# Patient Record
Sex: Female | Born: 1980 | Race: White | Hispanic: No | Marital: Married | Smoking: Never smoker
Health system: Southern US, Community
[De-identification: ages and names within clinical notes are randomized; demographics above are authoritative.]

## PROBLEM LIST (undated history)

## (undated) DIAGNOSIS — Z789 Other specified health status: Secondary | ICD-10-CM

## (undated) DIAGNOSIS — R51 Headache: Secondary | ICD-10-CM

## (undated) DIAGNOSIS — R519 Headache, unspecified: Secondary | ICD-10-CM

## (undated) DIAGNOSIS — E039 Hypothyroidism, unspecified: Secondary | ICD-10-CM

## (undated) HISTORY — PX: COLPOSCOPY: SHX161

## (undated) HISTORY — PX: OTHER SURGICAL HISTORY: SHX169

---
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
## 2005-03-28 ENCOUNTER — Emergency Department (HOSPITAL_COMMUNITY): Admission: EM | Admit: 2005-03-28 | Discharge: 2005-03-29 | Payer: Self-pay | Admitting: Emergency Medicine

## 2005-04-04 ENCOUNTER — Other Ambulatory Visit: Admission: RE | Admit: 2005-04-04 | Discharge: 2005-04-04 | Payer: Self-pay | Admitting: Obstetrics and Gynecology

## 2008-12-10 ENCOUNTER — Inpatient Hospital Stay (HOSPITAL_COMMUNITY): Admission: RE | Admit: 2008-12-10 | Discharge: 2008-12-13 | Payer: Self-pay | Admitting: Obstetrics and Gynecology

## 2010-05-04 LAB — CBC
HCT: 29.1 % — ABNORMAL LOW (ref 36.0–46.0)
HCT: 37.1 % (ref 36.0–46.0)
Hemoglobin: 12.6 g/dL (ref 12.0–15.0)
Hemoglobin: 9.9 g/dL — ABNORMAL LOW (ref 12.0–15.0)
MCHC: 34.1 g/dL (ref 30.0–36.0)
MCHC: 34.2 g/dL (ref 30.0–36.0)
MCV: 85.9 fL (ref 78.0–100.0)
MCV: 86.7 fL (ref 78.0–100.0)
Platelets: 127 10*3/uL — ABNORMAL LOW (ref 150–400)
Platelets: 187 10*3/uL (ref 150–400)
RBC: 3.35 MIL/uL — ABNORMAL LOW (ref 3.87–5.11)
RBC: 4.32 MIL/uL (ref 3.87–5.11)
RDW: 13.7 % (ref 11.5–15.5)
RDW: 13.7 % (ref 11.5–15.5)
WBC: 8.5 10*3/uL (ref 4.0–10.5)
WBC: 9.7 10*3/uL (ref 4.0–10.5)

## 2010-05-04 LAB — RPR: RPR Ser Ql: NONREACTIVE

## 2010-05-24 LAB — RUBELLA ANTIBODY, IGM: Rubella: IMMUNE

## 2010-05-24 LAB — ABO/RH: RH Type: POSITIVE

## 2010-05-24 LAB — RPR: RPR: NONREACTIVE

## 2010-05-24 LAB — ANTIBODY SCREEN: Antibody Screen: NEGATIVE

## 2010-07-15 ENCOUNTER — Inpatient Hospital Stay (HOSPITAL_COMMUNITY)
Admission: AD | Admit: 2010-07-15 | Discharge: 2010-07-16 | Disposition: A | Discharge status: Home / Self Care | Payer: BC Managed Care – PPO | Source: Ambulatory Visit | Attending: Obstetrics and Gynecology | Admitting: Obstetrics and Gynecology

## 2010-07-15 DIAGNOSIS — R109 Unspecified abdominal pain: Secondary | ICD-10-CM | POA: Insufficient documentation

## 2010-07-15 DIAGNOSIS — O99891 Other specified diseases and conditions complicating pregnancy: Secondary | ICD-10-CM | POA: Insufficient documentation

## 2010-07-15 DIAGNOSIS — O9989 Other specified diseases and conditions complicating pregnancy, childbirth and the puerperium: Secondary | ICD-10-CM

## 2010-07-16 LAB — CBC
HCT: 35.8 % — ABNORMAL LOW (ref 36.0–46.0)
Hemoglobin: 12.2 g/dL (ref 12.0–15.0)
MCH: 28 pg (ref 26.0–34.0)
MCHC: 34.1 g/dL (ref 30.0–36.0)
MCV: 82.3 fL (ref 78.0–100.0)
RBC: 4.35 MIL/uL (ref 3.87–5.11)

## 2010-07-16 LAB — URINALYSIS, ROUTINE W REFLEX MICROSCOPIC
Bilirubin Urine: NEGATIVE
Glucose, UA: NEGATIVE mg/dL
Hgb urine dipstick: NEGATIVE
Ketones, ur: NEGATIVE mg/dL
Nitrite: NEGATIVE
Protein, ur: NEGATIVE mg/dL
Specific Gravity, Urine: <1.005 — ABNORMAL LOW (ref 1.005–1.030)
Urobilinogen, UA: 0.2 mg/dL (ref 0.0–1.0)
pH: 6 (ref 5.0–8.0)

## 2010-07-16 LAB — URINE MICROSCOPIC-ADD ON

## 2010-07-16 LAB — WET PREP, GENITAL: Yeast Wet Prep HPF POC: NONE SEEN

## 2010-07-17 LAB — URINE CULTURE
Colony Count: NO GROWTH
Culture: NO GROWTH

## 2010-07-18 LAB — GC/CHLAMYDIA PROBE AMP, GENITAL: Chlamydia, DNA Probe: NEGATIVE

## 2010-12-19 ENCOUNTER — Encounter (HOSPITAL_COMMUNITY): Payer: Self-pay

## 2010-12-20 ENCOUNTER — Encounter (HOSPITAL_COMMUNITY): Payer: Self-pay

## 2010-12-21 ENCOUNTER — Encounter (HOSPITAL_COMMUNITY): Payer: Self-pay

## 2010-12-21 ENCOUNTER — Encounter (HOSPITAL_COMMUNITY)
Admission: RE | Admit: 2010-12-21 | Discharge: 2010-12-21 | Disposition: A | Discharge status: Home / Self Care | Payer: BC Managed Care – PPO | Source: Ambulatory Visit | Attending: Obstetrics and Gynecology | Admitting: Obstetrics and Gynecology

## 2010-12-21 HISTORY — DX: Other specified health status: Z78.9

## 2010-12-21 LAB — SURGICAL PCR SCREEN: MRSA, PCR: NEGATIVE

## 2010-12-21 NOTE — Patient Instructions (Addendum)
YOUR PROCEDURE IS SCHEDULED ON:12/29/10  ENTER THROUGH THE MAIN ENTRANCE OF [facility] AT:6am   USE DESK PHONE AND DIAL [phone] TO INFORM us OF YOUR ARRIVAL  CALL [phone] IF YOU HAVE ANY QUESTIONS OR PROBLEMS PRIOR TO YOUR ARRIVAL.  REMEMBER: DO NOT EAT OR DRINK AFTER MIDNIGHT : Wed  SPECIAL INSTRUCTIONS:   YOU MAY BRUSH YOUR TEETH THE MORNING OF SURGERY   TAKE THESE MEDICINES THE DAY OF SURGERY WITH SIP OF WATER:none   DO NOT WEAR JEWELRY, EYE MAKEUP, LIPSTICK OR DARK FINGERNAIL POLISH DO NOT WEAR LOTIONS OR DEODORANT DO NOT SHAVE FOR 48 HOURS PRIOR TO SURGERY  YOU WILL NOT BE ALLOWED TO DRIVE YOURSELF HOME.  NAME OF DRIVER:Charlie- [phone]

## 2010-12-26 NOTE — H&P (Addendum)
  H&P Dictated # [id number] 12/26/10 Pt seen and examined H&P is current 12/28/713 DL

## 2010-12-26 NOTE — H&P (Signed)
NAME:  Brancato, Valarie              ACCOUNT NO.:  [id number]  MEDICAL RECORD NO.:  [id number]  LOCATION:  SDC                           FACILITY:  [facility]  PHYSICIAN:  Dineen Kid. Rana Snare, M.D.    DATE OF BIRTH:  01/06/1981  DATE OF ADMISSION:  12/21/2010 DATE OF DISCHARGE:  12/21/2010                             HISTORY & PHYSICAL   Ms. Piggott is going to be admitted to [facility] on November 29 for cesarean section.  HISTORY OF PRESENT ILLNESS:  Ms. Orvan Falconer is a 30 year old, G2, P1, presenting at 39-6/7 weeks for repeat cesarean section.  She has a history of previous cesarean section and desires repeat.  Estimated date of confinement is December 30, 2010.  Group B strep is negative. Pregnancy has been uncomplicated with a normal 1st trimester screen, AFP, and normal level 2 ultrasound.  PAST MEDICAL HISTORY:  Negative.  PAST SURGICAL HISTORY:  Cesarean section for borderline pelvis.  MEDICATIONS:  Prenatal vitamins.  ALLERGIES:  She has no known drug allergies.  PHYSICAL EXAM:  VITAL SIGNS:  Her blood pressure is 120/80.  HEART: Regular rate and rhythm. LUNGS:  Clear to auscultation bilaterally. ABDOMEN:  Gravid, nontender. PELVIC:  Cervix is closed, thick, and high.  IMPRESSION AND PLAN:  Intrauterine pregnancy at 39+ weeks gestational age, previous cesarean section, desiring repeat.  PLAN:  Repeat low segment transverse cesarean section.  Risks and benefits were discussed at length.  Informed consent was obtained.     Dineen Kid Rana Snare, M.D.     DCL/MEDQ  D:  12/26/2010  T:  12/26/2010  Job:  [id number]

## 2010-12-28 MED ORDER — DEXTROSE 5 % IV SOLN
1.0000 g | INTRAVENOUS | Status: AC
  Administered 2010-12-29: 1 g via INTRAVENOUS
  Filled 2010-12-28: qty 1

## 2010-12-29 ENCOUNTER — Inpatient Hospital Stay (HOSPITAL_COMMUNITY): Payer: BC Managed Care – PPO | Admitting: Anesthesiology

## 2010-12-29 ENCOUNTER — Encounter (HOSPITAL_COMMUNITY): Payer: Self-pay | Admitting: Anesthesiology

## 2010-12-29 ENCOUNTER — Encounter (HOSPITAL_COMMUNITY): Payer: Self-pay | Admitting: *Deleted

## 2010-12-29 ENCOUNTER — Inpatient Hospital Stay (HOSPITAL_COMMUNITY)
Admission: RE | Admit: 2010-12-29 | Discharge: 2011-01-01 | DRG: 371 | Disposition: A | Discharge status: Home / Self Care | Payer: BC Managed Care – PPO | Source: Ambulatory Visit | Attending: Obstetrics and Gynecology | Admitting: Obstetrics and Gynecology

## 2010-12-29 ENCOUNTER — Encounter (HOSPITAL_COMMUNITY)
Admission: RE | Disposition: A | Discharge status: Home / Self Care | Payer: Self-pay | Source: Ambulatory Visit | Attending: Obstetrics and Gynecology

## 2010-12-29 DIAGNOSIS — Z01818 Encounter for other preprocedural examination: Secondary | ICD-10-CM

## 2010-12-29 DIAGNOSIS — O34219 Maternal care for unspecified type scar from previous cesarean delivery: Principal | ICD-10-CM | POA: Diagnosis present

## 2010-12-29 DIAGNOSIS — Z01812 Encounter for preprocedural laboratory examination: Secondary | ICD-10-CM

## 2010-12-29 DIAGNOSIS — Z98891 History of uterine scar from previous surgery: Secondary | ICD-10-CM

## 2010-12-29 LAB — CBC
Hemoglobin: 12.6 g/dL (ref 12.0–15.0)
MCH: 28.1 pg (ref 26.0–34.0)
MCHC: 33.2 g/dL (ref 30.0–36.0)
MCV: 84.6 fL (ref 78.0–100.0)
RBC: 4.49 MIL/uL (ref 3.87–5.11)

## 2010-12-29 SURGERY — Surgical Case
Anesthesia: Regional

## 2010-12-29 MED ORDER — LACTATED RINGERS IV SOLN
INTRAVENOUS | Status: DC
  Administered 2010-12-29: 1000 mL via INTRAVENOUS
  Administered 2010-12-29: 09:00:00 via INTRAVENOUS

## 2010-12-29 MED ORDER — ONDANSETRON HCL 4 MG/2ML IJ SOLN: 4.0000 mg | Freq: Three times a day (TID) | INTRAMUSCULAR | Status: DC | PRN

## 2010-12-29 MED ORDER — DIBUCAINE 1 % RE OINT: 1.0000 "application " | TOPICAL_OINTMENT | RECTAL | Status: DC | PRN

## 2010-12-29 MED ORDER — SIMETHICONE 80 MG PO CHEW: 80.0000 mg | CHEWABLE_TABLET | ORAL | Status: DC | PRN

## 2010-12-29 MED ORDER — OXYTOCIN 10 UNIT/ML IJ SOLN
INTRAMUSCULAR | Status: AC
  Filled 2010-12-29: qty 2

## 2010-12-29 MED ORDER — SCOPOLAMINE 1 MG/3DAYS TD PT72
1.0000 | MEDICATED_PATCH | Freq: Once | TRANSDERMAL | Status: DC
  Filled 2010-12-29: qty 1

## 2010-12-29 MED ORDER — SCOPOLAMINE 1 MG/3DAYS TD PT72
1.0000 | MEDICATED_PATCH | Freq: Once | TRANSDERMAL | Status: DC
  Administered 2010-12-29: 1.5 mg via TRANSDERMAL

## 2010-12-29 MED ORDER — KETOROLAC TROMETHAMINE 30 MG/ML IJ SOLN: 30.0000 mg | Freq: Four times a day (QID) | INTRAMUSCULAR | Status: AC | PRN

## 2010-12-29 MED ORDER — DIPHENHYDRAMINE HCL 50 MG/ML IJ SOLN
12.5000 mg | INTRAMUSCULAR | Status: DC | PRN
  Filled 2010-12-29: qty 1

## 2010-12-29 MED ORDER — IBUPROFEN 600 MG PO TABS
600.0000 mg | ORAL_TABLET | Freq: Four times a day (QID) | ORAL | Status: DC | PRN
  Administered 2010-12-31: 600 mg via ORAL
  Filled 2010-12-29 (×12): qty 1

## 2010-12-29 MED ORDER — IBUPROFEN 600 MG PO TABS
600.0000 mg | ORAL_TABLET | Freq: Four times a day (QID) | ORAL | Status: DC
  Administered 2010-12-29 – 2011-01-01 (×10): 600 mg via ORAL

## 2010-12-29 MED ORDER — KETOROLAC TROMETHAMINE 60 MG/2ML IM SOLN
60.0000 mg | Freq: Once | INTRAMUSCULAR | Status: AC | PRN
  Administered 2010-12-29: 60 mg via INTRAMUSCULAR

## 2010-12-29 MED ORDER — ONDANSETRON HCL 4 MG/2ML IJ SOLN
INTRAMUSCULAR | Status: DC | PRN
  Administered 2010-12-29: 4 mg via INTRAVENOUS

## 2010-12-29 MED ORDER — NALBUPHINE SYRINGE 5 MG/0.5 ML
5.0000 mg | INJECTION | INTRAMUSCULAR | Status: DC | PRN
  Filled 2010-12-29: qty 1

## 2010-12-29 MED ORDER — ZOLPIDEM TARTRATE 5 MG PO TABS: 5.0000 mg | ORAL_TABLET | Freq: Every evening | ORAL | Status: DC | PRN

## 2010-12-29 MED ORDER — NALOXONE HCL 0.4 MG/ML IJ SOLN: 0.4000 mg | INTRAMUSCULAR | Status: DC | PRN

## 2010-12-29 MED ORDER — MORPHINE SULFATE (PF) 0.5 MG/ML IJ SOLN
INTRAMUSCULAR | Status: DC | PRN
  Administered 2010-12-29: .1 mg via INTRATHECAL

## 2010-12-29 MED ORDER — LACTATED RINGERS IV SOLN
INTRAVENOUS | Status: DC
  Administered 2010-12-30: via INTRAVENOUS

## 2010-12-29 MED ORDER — OXYTOCIN 20 UNITS IN LACTATED RINGERS INFUSION - SIMPLE: 125.0000 mL/h | INTRAVENOUS | Status: AC

## 2010-12-29 MED ORDER — OXYCODONE-ACETAMINOPHEN 5-325 MG PO TABS
1.0000 | ORAL_TABLET | ORAL | Status: DC | PRN
  Administered 2010-12-30 – 2010-12-31 (×5): 1 via ORAL
  Filled 2010-12-29 (×6): qty 1

## 2010-12-29 MED ORDER — ONDANSETRON HCL 4 MG PO TABS: 4.0000 mg | ORAL_TABLET | ORAL | Status: DC | PRN

## 2010-12-29 MED ORDER — SODIUM CHLORIDE 0.9 % IJ SOLN: 3.0000 mL | INTRAMUSCULAR | Status: DC | PRN

## 2010-12-29 MED ORDER — DIPHENHYDRAMINE HCL 25 MG PO CAPS: 25.0000 mg | ORAL_CAPSULE | ORAL | Status: DC | PRN

## 2010-12-29 MED ORDER — ONDANSETRON HCL 4 MG/2ML IJ SOLN
INTRAMUSCULAR | Status: AC
  Filled 2010-12-29: qty 2

## 2010-12-29 MED ORDER — MORPHINE SULFATE 0.5 MG/ML IJ SOLN
INTRAMUSCULAR | Status: AC
  Filled 2010-12-29: qty 10

## 2010-12-29 MED ORDER — HYDROMORPHONE HCL PF 1 MG/ML IJ SOLN: 0.2500 mg | INTRAMUSCULAR | Status: DC | PRN

## 2010-12-29 MED ORDER — FENTANYL CITRATE 0.05 MG/ML IJ SOLN
INTRAMUSCULAR | Status: DC | PRN
  Administered 2010-12-29: 25 ug via INTRATHECAL
  Administered 2010-12-29: 75 ug via INTRAVENOUS

## 2010-12-29 MED ORDER — PRENATAL PLUS 27-1 MG PO TABS
1.0000 | ORAL_TABLET | Freq: Every day | ORAL | Status: DC
  Administered 2010-12-30 – 2011-01-01 (×3): 1 via ORAL
  Filled 2010-12-29 (×3): qty 1

## 2010-12-29 MED ORDER — PHENYLEPHRINE HCL 10 MG/ML IJ SOLN
INTRAMUSCULAR | Status: DC | PRN
  Administered 2010-12-29: 40 ug via INTRAVENOUS

## 2010-12-29 MED ORDER — LANOLIN HYDROUS EX OINT: 1.0000 "application " | TOPICAL_OINTMENT | CUTANEOUS | Status: DC | PRN

## 2010-12-29 MED ORDER — SCOPOLAMINE 1 MG/3DAYS TD PT72
MEDICATED_PATCH | TRANSDERMAL | Status: AC
  Administered 2010-12-29: 1.5 mg via TRANSDERMAL
  Filled 2010-12-29: qty 1

## 2010-12-29 MED ORDER — PRENATAL PLUS 27-1 MG PO TABS: 1.0000 | ORAL_TABLET | Freq: Every day | ORAL | Status: DC

## 2010-12-29 MED ORDER — WITCH HAZEL-GLYCERIN EX PADS: 1.0000 "application " | MEDICATED_PAD | CUTANEOUS | Status: DC | PRN

## 2010-12-29 MED ORDER — METOCLOPRAMIDE HCL 5 MG/ML IJ SOLN: 10.0000 mg | Freq: Three times a day (TID) | INTRAMUSCULAR | Status: DC | PRN

## 2010-12-29 MED ORDER — LACTATED RINGERS IV SOLN
INTRAVENOUS | Status: DC | PRN
  Administered 2010-12-29 (×2): via INTRAVENOUS

## 2010-12-29 MED ORDER — KETOROLAC TROMETHAMINE 60 MG/2ML IM SOLN
INTRAMUSCULAR | Status: AC
  Filled 2010-12-29: qty 2

## 2010-12-29 MED ORDER — ONDANSETRON HCL 4 MG/2ML IJ SOLN: 4.0000 mg | INTRAMUSCULAR | Status: DC | PRN

## 2010-12-29 MED ORDER — OXYTOCIN 20 UNITS IN LACTATED RINGERS INFUSION - SIMPLE
INTRAVENOUS | Status: DC | PRN
  Administered 2010-12-29: 20 [IU] via INTRAVENOUS

## 2010-12-29 MED ORDER — SENNOSIDES-DOCUSATE SODIUM 8.6-50 MG PO TABS
2.0000 | ORAL_TABLET | Freq: Every day | ORAL | Status: DC
  Administered 2010-12-29 – 2010-12-31 (×3): 2 via ORAL

## 2010-12-29 MED ORDER — DIPHENHYDRAMINE HCL 50 MG/ML IJ SOLN: 25.0000 mg | INTRAMUSCULAR | Status: DC | PRN

## 2010-12-29 MED ORDER — PHENYLEPHRINE 40 MCG/ML (10ML) SYRINGE FOR IV PUSH (FOR BLOOD PRESSURE SUPPORT)
PREFILLED_SYRINGE | INTRAVENOUS | Status: AC
  Filled 2010-12-29: qty 5

## 2010-12-29 MED ORDER — SODIUM CHLORIDE 0.9 % IV SOLN
1.0000 ug/kg/h | INTRAVENOUS | Status: DC | PRN
  Filled 2010-12-29: qty 2.5

## 2010-12-29 MED ORDER — SIMETHICONE 80 MG PO CHEW
80.0000 mg | CHEWABLE_TABLET | Freq: Three times a day (TID) | ORAL | Status: DC
  Administered 2010-12-29 – 2011-01-01 (×11): 80 mg via ORAL

## 2010-12-29 MED ORDER — MEPERIDINE HCL 25 MG/ML IJ SOLN: 6.2500 mg | INTRAMUSCULAR | Status: DC | PRN

## 2010-12-29 MED ORDER — MENTHOL 3 MG MT LOZG: 1.0000 | LOZENGE | OROMUCOSAL | Status: DC | PRN

## 2010-12-29 MED ORDER — DIPHENHYDRAMINE HCL 25 MG PO CAPS: 25.0000 mg | ORAL_CAPSULE | Freq: Four times a day (QID) | ORAL | Status: DC | PRN

## 2010-12-29 MED ORDER — BUPIVACAINE IN DEXTROSE 0.75-8.25 % IT SOLN
INTRATHECAL | Status: DC | PRN
  Administered 2010-12-29: 12 mg via INTRATHECAL

## 2010-12-29 MED ORDER — FENTANYL CITRATE 0.05 MG/ML IJ SOLN
INTRAMUSCULAR | Status: AC
  Filled 2010-12-29: qty 2

## 2010-12-29 MED ORDER — TETANUS-DIPHTH-ACELL PERTUSSIS 5-2.5-18.5 LF-MCG/0.5 IM SUSP: 0.5000 mL | Freq: Once | INTRAMUSCULAR | Status: DC

## 2010-12-29 SURGICAL SUPPLY — 27 items
CLOTH BEACON ORANGE TIMEOUT ST (SAFETY) ×2 IMPLANT
DRESSING TELFA 8X3 (GAUZE/BANDAGES/DRESSINGS) ×2 IMPLANT
DRSG PAD ABDOMINAL 8X10 ST (GAUZE/BANDAGES/DRESSINGS) ×1 IMPLANT
ELECT REM PT RETURN 9FT ADLT (ELECTROSURGICAL) ×2
ELECTRODE REM PT RTRN 9FT ADLT (ELECTROSURGICAL) ×1 IMPLANT
EXTRACTOR VACUUM M CUP 4 TUBE (SUCTIONS) ×1 IMPLANT
GAUZE SPONGE 4X4 12PLY STRL LF (GAUZE/BANDAGES/DRESSINGS) ×1 IMPLANT
GLOVE BIO SURGEON STRL SZ8 (GLOVE) ×2 IMPLANT
GLOVE SURG ORTHO 8.0 STRL STRW (GLOVE) ×2 IMPLANT
GOWN PREVENTION PLUS LG XLONG (DISPOSABLE) ×4 IMPLANT
KIT ABG SYR 3ML LUER SLIP (SYRINGE) ×2 IMPLANT
NDL HYPO 25X5/8 SAFETYGLIDE (NEEDLE) ×1 IMPLANT
NEEDLE HYPO 25X5/8 SAFETYGLIDE (NEEDLE) ×2 IMPLANT
NS IRRIG 1000ML POUR BTL (IV SOLUTION) ×2 IMPLANT
PACK C SECTION WH (CUSTOM PROCEDURE TRAY) ×2 IMPLANT
PAD ABD 7.5X8 STRL (GAUZE/BANDAGES/DRESSINGS) ×1 IMPLANT
SLEEVE SCD COMPRESS KNEE MED (MISCELLANEOUS) IMPLANT
STAPLER VISISTAT 35W (STAPLE) IMPLANT
SUT MNCRL 0 VIOLET CTX 36 (SUTURE) ×3 IMPLANT
SUT MONOCRYL 0 CTX 36 (SUTURE) ×3
SUT PDS AB 1 CT  36 (SUTURE)
SUT PDS AB 1 CT 36 (SUTURE) IMPLANT
SUT VIC AB 1 CTX 36 (SUTURE)
SUT VIC AB 1 CTX36XBRD ANBCTRL (SUTURE) IMPLANT
TOWEL OR 17X24 6PK STRL BLUE (TOWEL DISPOSABLE) ×4 IMPLANT
TRAY FOLEY CATH 14FR (SET/KITS/TRAYS/PACK) ×2 IMPLANT
WATER STERILE IRR 1000ML POUR (IV SOLUTION) ×2 IMPLANT

## 2010-12-29 NOTE — Addendum Note (Signed)
Addendum  created 12/29/10 1136 by Marrion Coy   Modules edited:Anesthesia Medication Administration

## 2010-12-29 NOTE — Addendum Note (Signed)
Addendum  created 12/29/10 1338 by Mauricia Area, CRNA   Modules edited:Notes Section

## 2010-12-29 NOTE — Anesthesia Postprocedure Evaluation (Signed)
Anesthesia Post Note  Patient: Michaela Valentine  Procedure(s) Performed:  CESAREAN SECTION - repeat  Anesthesia type: Spinal  Patient location: PACU  Post pain: Pain level controlled  Post assessment: Post-op Vital signs reviewed  Last Vitals:  Filed Vitals:   12/29/10 0845  BP: 111/65  Pulse: 76  Temp:   Resp: 18    Post vital signs: Reviewed  Level of consciousness: awake  Complications: No apparent anesthesia complications

## 2010-12-29 NOTE — Anesthesia Postprocedure Evaluation (Signed)
  Anesthesia Post-op Note  Patient: Michaela Valentine  Procedure(s) Performed:  CESAREAN SECTION - repeat  Patient Location: Mother/Baby  Anesthesia Type: Spinal  Level of Consciousness: awake, alert  and oriented  Airway and Oxygen Therapy: Patient Spontanous Breathing  Post-op Pain: mild  Post-op Assessment: Post-op Vital signs reviewed, Patient's Cardiovascular Status Stable, Respiratory Function Stable, Patent Airway and Pain level controlled  Post-op Vital Signs: Reviewed and stable  Complications: No apparent anesthesia complications

## 2010-12-29 NOTE — Addendum Note (Signed)
Addendum  created 12/29/10 1136 by Jasen Hartstein   Modules edited:Anesthesia Medication Administration    

## 2010-12-29 NOTE — Anesthesia Preprocedure Evaluation (Signed)
Anesthesia Evaluation  Patient identified by MRN, date of birth, ID band Patient awake    Reviewed: Allergy & Precautions, H&P , Patient's Chart, lab work & pertinent test results  Airway Mallampati: II TM Distance: >3 FB Neck ROM: full    Dental No notable dental hx.    Pulmonary  clear to auscultation  Pulmonary exam normal       Cardiovascular Exercise Tolerance: Good regular Normal    Neuro/Psych    GI/Hepatic   Endo/Other  Morbid obesity  Renal/GU      Musculoskeletal   Abdominal   Peds  Hematology   Anesthesia Other Findings   Reproductive/Obstetrics                           Anesthesia Physical Anesthesia Plan  ASA: III  Anesthesia Plan: General   Post-op Pain Management:    Induction: Intravenous  Airway Management Planned: Oral ETT  Additional Equipment:   Intra-op Plan:   Post-operative Plan:   Informed Consent: I have reviewed the patients History and Physical, chart, labs and discussed the procedure including the risks, benefits and alternatives for the proposed anesthesia with the patient or authorized representative who has indicated his/her understanding and acceptance.   Dental Advisory Given and Dental advisory given  Plan Discussed with: CRNA and Surgeon  Anesthesia Plan Comments: (  Discussed  general anesthesia, including possible nausea, instrumentation of airway, sore throat,pulmonary aspiration, etc. I asked if the were any outstanding questions, or  concerns before we proceeded. )        Anesthesia Quick Evaluation

## 2010-12-29 NOTE — Anesthesia Procedure Notes (Addendum)
Spinal  Patient location during procedure: OR Staffing Performed by: anesthesiologist  Preanesthetic Checklist Completed: patient identified, site marked, surgical consent, pre-op evaluation, timeout performed, IV checked, risks and benefits discussed and monitors and equipment checked Spinal Block Patient position: sitting Prep: DuraPrep Patient monitoring: heart rate, cardiac monitor, continuous pulse ox and blood pressure Approach: midline Location: L3-4 Injection technique: single-shot Needle Needle type: Sprotte  Needle gauge: 24 G Needle length: 9 cm Assessment Sensory level: T4 Additional Notes Patient identified.  Risk benefits discussed including failed block, incomplete pain control, headache, nerve damage, paralysis, blood pressure changes, nausea, vomiting, reactions to medication both toxic or allergic, and postpartum back pain.  Patient expressed understanding and wished to proceed.  All questions were answered.  Sterile technique used throughout procedure.  CSF was clear.  No parasthesia or other complications.  Please see nursing notes for vital signs.

## 2010-12-29 NOTE — Op Note (Signed)
Cesarean Section Procedure Note  Pre-operative Diagnosis: IUP at 39 weeks, Prev C/S for repeat  Post-operative Diagnosis: same  Surgeon: Turner Daniels   Assistants: none  Anesthesia:Spinal  Procedure:  Low Segment Transverse cesarean section  Procedure Details  The patient was seen in the Holding Room. The risks, benefits, complications, treatment options, and expected outcomes were discussed with the patient.  The patient concurred with the proposed plan, giving informed consent.  The site of surgery properly noted/marked.. A Time Out was held and the above information confirmed.  After induction of anesthesia, the patient was draped and prepped in the usual sterile manner. A Pfannenstiel incision was made and carried down through the subcutaneous tissue to the fascia. Fascial incision was made and extended transversely. The fascia was separated from the underlying rectus tissue superiorly and inferiorly. The peritoneum was identified and entered. Peritoneal incision was extended longitudinally. The utero-vesical peritoneal reflection was incised transversely and the bladder flap was bluntly freed from the lower uterine segment. A low transverse uterine incision was made. Delivered from Vertex presentation with a Vacuum extractor 2 pulls was a 8+9 pound baby with Apgar scores of 9 at one minute and 9 at five minutes. After the umbilical cord was clamped and cut cord blood was obtained for evaluation. The placenta was removed intact and appeared normal. The uterine outline, tubes and ovaries appeared normal. The uterine incision was closed with running locked sutures of 0 monocryl and imbricated with 0 monocryl. Hemostasis was observed. Lavage was carried out until clear. The peritoneum was then closed with 0 monocryl and rectus muscles plicated in the midline.  After hemostasis was assured, the fascia was then reapproximated with running sutures of 0 PDS. Irrigation was applied and after adequate  hemostasis was assured, the skin was reapproximated with staples.  Instrument, sponge, and needle counts were correct prior the abdominal closure and at the conclusion of the case. The patient received 1 gram cefotetan preoperatively.  Findings: Viable female, ph art 7.30  Estimated Blood Loss:  600         Specimens: Placenta was sent to L&D         Complications:  None

## 2010-12-29 NOTE — Consult Note (Addendum)
Neonatology Note:  Attendance at C-section:  I was asked to attend this repeat C/S at term. The mother is a G2P1 GBS neg with previous c/s. ROM at delivery, fluid clear. Vacuum-assisted delivery. Infant vigorous with good spontaneous cry and tone. Needed only minimal bulb suctioning. Ap 9/9. Lungs clear to ausc in DR. To CN to care of Pediatrician.  Sharonda Llamas, MD  

## 2010-12-29 NOTE — Transfer of Care (Signed)
Immediate Anesthesia Transfer of Care Note  Patient: Michaela Valentine  Procedure(s) Performed:  CESAREAN SECTION - repeat  Patient Location: PACU  Anesthesia Type: Spinal  Level of Consciousness: awake, alert  and oriented  Airway & Oxygen Therapy: Patient Spontanous Breathing  Post-op Assessment: Report given to PACU RN and Post -op Vital signs reviewed and stable  Post vital signs: Reviewed and stable  Complications: No apparent anesthesia complications

## 2010-12-30 LAB — CBC
MCH: 28.5 pg (ref 26.0–34.0)
MCHC: 33.1 g/dL (ref 30.0–36.0)
Platelets: 154 10*3/uL (ref 150–400)
RBC: 3.86 MIL/uL — ABNORMAL LOW (ref 3.87–5.11)
RDW: 14.4 % (ref 11.5–15.5)

## 2010-12-30 NOTE — Progress Notes (Signed)
Subjective: Postpartum Day 1: Cesarean Delivery Patient reports tolerating PO.    Objective: Vital signs in last 24 hours: Temp:  [97.4 F (36.3 C)-98.5 F (36.9 C)] 98.4 F (36.9 C) (11/30 0445) Pulse Rate:  [56-77] 62  (11/30 0445) Resp:  [16-22] 20  (11/30 0445) BP: (96-118)/(56-75) 104/63 mmHg (11/30 0445) SpO2:  [95 %-100 %] 97 % (11/30 0445) Weight:  [97.07 kg (214 lb)] 214 lb (97.07 kg) (11/29 1427)  Physical Exam:  General: alert and cooperative Lochia: appropriate Uterine Fundus: firm abd dressing CDI Voiding s difficulty DVT Evaluation: No evidence of DVT seen on physical exam.   Basename 12/30/10 0525 12/29/10 0618  HGB 11.0* 12.6  HCT 33.2* 38.0    Assessment/Plan: Status post Cesarean section. Doing well postoperatively.  Continue current care.  Donaciano Range G 12/30/2010, 8:36 AM

## 2010-12-31 NOTE — Progress Notes (Signed)
Subjective: Postpartum Day 2 Cesarean Delivery Patient reports incisional pain, tolerating PO and no problems voiding.    Objective: Vital signs in last 24 hours: Temp:  [98.1 F (36.7 C)-98.4 F (36.9 C)] 98.1 F (36.7 C) (11/30 2100) Pulse Rate:  [87-98] 98  (11/30 2100) Resp:  [18-20] 20  (11/30 2100) BP: (122-126)/(83-87) 126/83 mmHg (11/30 2100)  Physical Exam:  General: alert and cooperative Lochia: appropriate Uterine Fundus: firm Incision: healing well, no significant drainage, no dehiscence, no significant erythema DVT Evaluation: No evidence of DVT seen on physical exam.   Basename 12/30/10 0525 12/29/10 0618  HGB 11.0* 12.6  HCT 33.2* 38.0    Assessment/Plan: Status post Cesarean section. Doing well postoperatively.  Continue current care.  Keaisha Sublette L 12/31/2010, 10:01 AM

## 2011-01-01 MED ORDER — OXYCODONE-ACETAMINOPHEN 5-325 MG PO TABS: 1.0000 | ORAL_TABLET | ORAL | Status: AC | PRN

## 2011-01-01 MED ORDER — IBUPROFEN 600 MG PO TABS: 600.0000 mg | ORAL_TABLET | Freq: Four times a day (QID) | ORAL | Status: AC | PRN

## 2011-01-01 NOTE — Progress Notes (Signed)
Subjective: Postpartum Day 3: Cesarean Delivery Patient reports tolerating PO, + flatus and no problems voiding.    Objective: Vital signs in last 24 hours: Temp:  [97.9 F (36.6 C)-98.6 F (37 C)] 98.6 F (37 C) (12/02 0512) Pulse Rate:  [76-85] 76  (12/02 0512) Resp:  [18-20] 18  (12/02 0512) BP: (105-130)/(68-77) 113/76 mmHg (12/02 2956)  Physical Exam:  General: alert and cooperative Lochia: appropriate Uterine Fundus: firm Incision: healing well, no significant drainage, no dehiscence, no significant erythema DVT Evaluation: No evidence of DVT seen on physical exam.   Basename 12/30/10 0525  HGB 11.0*  HCT 33.2*    Assessment/Plan: Status post Cesarean section. Doing well postoperatively.  Discharge home with standard precautions and return to clinic on Wed to remove staples rx ibuprofen and percocet.  Jarrod Bodkins L 01/01/2011, 8:37 AM

## 2011-01-01 NOTE — Plan of Care (Signed)
Problem: Discharge Progression Outcomes Goal: Remove staples per MD order Outcome: Not Applicable Date Met:  01/01/11 Staples to be removed in MD office.

## 2011-01-01 NOTE — Discharge Summary (Signed)
Obstetric Discharge Summary Reason for Admission: cesarean section Prenatal Procedures: none Intrapartum Procedures: cesarean: low cervical, transverse Postpartum Procedures: none Complications-Operative and Postpartum: none Hemoglobin  Date Value Range Status  12/30/2010 11.0* 12.0-15.0 (g/dL) Final     HCT  Date Value Range Status  12/30/2010 33.2* 36.0-46.0 (%) Final    Discharge Diagnoses: Term Pregnancy-delivered  Discharge Information: Date: 01/01/2011 Activity: pelvic rest Diet: routine Medications: Ibuprofen and Percocet Condition: improved Instructions: refer to practice specific booklet Discharge to: home   Newborn Data: Live born female  Birth Weight: 8 lb 9.7 oz (3905 g) APGAR: 9, 9  Home with mother.  Iyauna Sing L 01/01/2011, 8:37 AM

## 2011-01-02 ENCOUNTER — Encounter (HOSPITAL_COMMUNITY): Payer: Self-pay | Admitting: Obstetrics and Gynecology

## 2011-05-02 ENCOUNTER — Other Ambulatory Visit: Payer: Self-pay | Admitting: Neurology

## 2011-05-02 DIAGNOSIS — H02402 Unspecified ptosis of left eyelid: Secondary | ICD-10-CM

## 2011-05-02 DIAGNOSIS — H93A9 Pulsatile tinnitus, unspecified ear: Secondary | ICD-10-CM

## 2011-05-11 ENCOUNTER — Ambulatory Visit
Admission: RE | Admit: 2011-05-11 | Discharge: 2011-05-11 | Disposition: A | Discharge status: Home / Self Care | Payer: BC Managed Care – PPO | Source: Ambulatory Visit | Attending: Neurology | Admitting: Neurology

## 2011-05-11 DIAGNOSIS — H02402 Unspecified ptosis of left eyelid: Secondary | ICD-10-CM

## 2011-05-11 DIAGNOSIS — H93A9 Pulsatile tinnitus, unspecified ear: Secondary | ICD-10-CM

## 2011-06-13 ENCOUNTER — Other Ambulatory Visit: Payer: Self-pay | Admitting: Otolaryngology

## 2011-06-13 DIAGNOSIS — J323 Chronic sphenoidal sinusitis: Secondary | ICD-10-CM

## 2011-06-15 ENCOUNTER — Ambulatory Visit
Admission: RE | Admit: 2011-06-15 | Discharge: 2011-06-15 | Disposition: A | Discharge status: Home / Self Care | Payer: BC Managed Care – PPO | Source: Ambulatory Visit | Attending: Otolaryngology | Admitting: Otolaryngology

## 2011-06-15 DIAGNOSIS — J323 Chronic sphenoidal sinusitis: Secondary | ICD-10-CM

## 2011-06-21 ENCOUNTER — Other Ambulatory Visit: Payer: Self-pay | Admitting: Otolaryngology

## 2013-06-26 LAB — OB RESULTS CONSOLE RPR: RPR: NONREACTIVE

## 2013-06-26 LAB — OB RESULTS CONSOLE HIV ANTIBODY (ROUTINE TESTING): HIV: NONREACTIVE

## 2013-06-26 LAB — OB RESULTS CONSOLE RUBELLA ANTIBODY, IGM: Rubella: IMMUNE

## 2013-06-26 LAB — OB RESULTS CONSOLE HEPATITIS B SURFACE ANTIGEN: HEP B S AG: NEGATIVE

## 2013-07-18 LAB — OB RESULTS CONSOLE GC/CHLAMYDIA
CHLAMYDIA, DNA PROBE: NEGATIVE
Gonorrhea: NEGATIVE

## 2013-10-07 ENCOUNTER — Other Ambulatory Visit (HOSPITAL_COMMUNITY): Payer: Self-pay | Admitting: Obstetrics and Gynecology

## 2013-10-07 ENCOUNTER — Other Ambulatory Visit: Payer: Self-pay

## 2013-10-07 DIAGNOSIS — O358XX1 Maternal care for other (suspected) fetal abnormality and damage, fetus 1: Secondary | ICD-10-CM

## 2013-10-08 ENCOUNTER — Ambulatory Visit (HOSPITAL_COMMUNITY)
Admission: RE | Admit: 2013-10-08 | Discharge: 2013-10-08 | Disposition: A | Discharge status: Home / Self Care | Payer: BC Managed Care – PPO | Source: Ambulatory Visit | Attending: Obstetrics and Gynecology | Admitting: Obstetrics and Gynecology

## 2013-10-08 ENCOUNTER — Encounter (HOSPITAL_COMMUNITY): Payer: Self-pay

## 2013-10-08 VITALS — BP 137/80 | Wt 212.0 lb

## 2013-10-08 DIAGNOSIS — Z3689 Encounter for other specified antenatal screening: Secondary | ICD-10-CM | POA: Diagnosis not present

## 2013-10-08 DIAGNOSIS — O358XX0 Maternal care for other (suspected) fetal abnormality and damage, not applicable or unspecified: Secondary | ICD-10-CM | POA: Insufficient documentation

## 2013-10-08 DIAGNOSIS — O358XX1 Maternal care for other (suspected) fetal abnormality and damage, fetus 1: Secondary | ICD-10-CM

## 2013-11-11 ENCOUNTER — Ambulatory Visit (HOSPITAL_COMMUNITY): Payer: BC Managed Care – PPO

## 2013-11-11 ENCOUNTER — Encounter (HOSPITAL_COMMUNITY): Payer: Self-pay

## 2013-11-11 ENCOUNTER — Ambulatory Visit (HOSPITAL_COMMUNITY)
Admission: RE | Admit: 2013-11-11 | Discharge: 2013-11-11 | Disposition: A | Discharge status: Home / Self Care | Payer: BC Managed Care – PPO | Source: Ambulatory Visit | Attending: Obstetrics and Gynecology | Admitting: Obstetrics and Gynecology

## 2013-11-11 VITALS — BP 123/68 | HR 115 | Wt 212.0 lb

## 2013-11-11 DIAGNOSIS — O358XX0 Maternal care for other (suspected) fetal abnormality and damage, not applicable or unspecified: Secondary | ICD-10-CM | POA: Diagnosis present

## 2013-11-11 DIAGNOSIS — O35AXX0 Maternal care for other (suspected) fetal abnormality and damage, fetal facial anomalies, not applicable or unspecified: Secondary | ICD-10-CM

## 2013-11-11 DIAGNOSIS — O358XX1 Maternal care for other (suspected) fetal abnormality and damage, fetus 1: Secondary | ICD-10-CM

## 2013-11-11 DIAGNOSIS — Z3A28 28 weeks gestation of pregnancy: Secondary | ICD-10-CM | POA: Diagnosis not present

## 2013-11-19 ENCOUNTER — Ambulatory Visit (HOSPITAL_COMMUNITY): Payer: BC Managed Care – PPO

## 2013-11-25 ENCOUNTER — Other Ambulatory Visit (HOSPITAL_COMMUNITY): Payer: Self-pay | Admitting: Maternal and Fetal Medicine

## 2013-11-25 DIAGNOSIS — O99283 Endocrine, nutritional and metabolic diseases complicating pregnancy, third trimester: Principal | ICD-10-CM

## 2013-11-25 DIAGNOSIS — Q369 Cleft lip, unilateral: Secondary | ICD-10-CM

## 2013-11-25 DIAGNOSIS — O34219 Maternal care for unspecified type scar from previous cesarean delivery: Secondary | ICD-10-CM

## 2013-11-25 DIAGNOSIS — E039 Hypothyroidism, unspecified: Secondary | ICD-10-CM

## 2013-12-01 ENCOUNTER — Encounter (HOSPITAL_COMMUNITY): Payer: Self-pay

## 2013-12-11 ENCOUNTER — Other Ambulatory Visit (HOSPITAL_COMMUNITY): Payer: Self-pay | Admitting: Maternal and Fetal Medicine

## 2013-12-11 ENCOUNTER — Ambulatory Visit (HOSPITAL_COMMUNITY)
Admission: RE | Admit: 2013-12-11 | Discharge: 2013-12-11 | Disposition: A | Discharge status: Home / Self Care | Payer: BC Managed Care – PPO | Source: Ambulatory Visit | Attending: Obstetrics and Gynecology | Admitting: Obstetrics and Gynecology

## 2013-12-11 ENCOUNTER — Encounter (HOSPITAL_COMMUNITY): Payer: Self-pay

## 2013-12-11 VITALS — BP 108/67 | HR 95 | Wt 214.0 lb

## 2013-12-11 DIAGNOSIS — Q369 Cleft lip, unilateral: Secondary | ICD-10-CM

## 2013-12-11 DIAGNOSIS — O358XX0 Maternal care for other (suspected) fetal abnormality and damage, not applicable or unspecified: Secondary | ICD-10-CM | POA: Insufficient documentation

## 2013-12-11 DIAGNOSIS — O34219 Maternal care for unspecified type scar from previous cesarean delivery: Secondary | ICD-10-CM

## 2013-12-11 DIAGNOSIS — O99283 Endocrine, nutritional and metabolic diseases complicating pregnancy, third trimester: Secondary | ICD-10-CM | POA: Insufficient documentation

## 2013-12-11 DIAGNOSIS — E039 Hypothyroidism, unspecified: Secondary | ICD-10-CM

## 2013-12-11 DIAGNOSIS — Z3A32 32 weeks gestation of pregnancy: Secondary | ICD-10-CM | POA: Insufficient documentation

## 2013-12-11 DIAGNOSIS — O99280 Endocrine, nutritional and metabolic diseases complicating pregnancy, unspecified trimester: Secondary | ICD-10-CM

## 2013-12-11 DIAGNOSIS — O3421 Maternal care for scar from previous cesarean delivery: Secondary | ICD-10-CM | POA: Insufficient documentation

## 2014-01-13 ENCOUNTER — Encounter (HOSPITAL_COMMUNITY): Payer: Self-pay

## 2014-01-13 ENCOUNTER — Ambulatory Visit (HOSPITAL_COMMUNITY)
Admission: RE | Admit: 2014-01-13 | Discharge: 2014-01-13 | Disposition: A | Discharge status: Home / Self Care | Payer: BC Managed Care – PPO | Source: Ambulatory Visit | Attending: Obstetrics and Gynecology | Admitting: Obstetrics and Gynecology

## 2014-01-13 DIAGNOSIS — O3421 Maternal care for scar from previous cesarean delivery: Secondary | ICD-10-CM | POA: Insufficient documentation

## 2014-01-13 DIAGNOSIS — Z3A37 37 weeks gestation of pregnancy: Secondary | ICD-10-CM | POA: Diagnosis not present

## 2014-01-13 DIAGNOSIS — O34219 Maternal care for unspecified type scar from previous cesarean delivery: Secondary | ICD-10-CM

## 2014-01-13 DIAGNOSIS — E039 Hypothyroidism, unspecified: Secondary | ICD-10-CM | POA: Diagnosis not present

## 2014-01-13 DIAGNOSIS — O358XX0 Maternal care for other (suspected) fetal abnormality and damage, not applicable or unspecified: Secondary | ICD-10-CM | POA: Insufficient documentation

## 2014-01-13 DIAGNOSIS — O99283 Endocrine, nutritional and metabolic diseases complicating pregnancy, third trimester: Secondary | ICD-10-CM | POA: Diagnosis not present

## 2014-01-13 DIAGNOSIS — O35AXX0 Maternal care for other (suspected) fetal abnormality and damage, fetal facial anomalies, not applicable or unspecified: Secondary | ICD-10-CM | POA: Insufficient documentation

## 2014-01-13 DIAGNOSIS — Q369 Cleft lip, unilateral: Secondary | ICD-10-CM

## 2014-01-21 ENCOUNTER — Encounter (HOSPITAL_COMMUNITY): Payer: Self-pay

## 2014-01-21 NOTE — H&P (Addendum)
Michaela Valentine is a 33 y.o. female presenting for Repeat Valentine/s at 39 weeks.  Pregnancy complicated by Valentine/S x 2 desires repeat.  Also Fetal Cleft palate.  Borderline oligohydraminos last week with AFI of 9. GBS+ History OB History    Gravida Para Term Preterm AB TAB SAB Ectopic Multiple Living   3 2 2       2      Past Medical History  Diagnosis Date  . No pertinent past medical history   . Headache     migraines with aura  . Hypothyroidism    Past Surgical History  Procedure Laterality Date  . Cesarean section    . Cesarean section  12/29/2010    Procedure: CESAREAN SECTION;  Surgeon: Michaela Danielsavid Valentine Kolden Dupee, MD;  Location: [facility] ORS;  Service: Gynecology;  Laterality: N/A;  repeat  . Colposcopy    . Sphenoid sinus cyst removed     Family History: family history is not on file. Social History:  reports that she has never smoked. She has never used smokeless tobacco. She reports that she does not drink alcohol or use illicit drugs.   Prenatal Transfer Tool  Maternal Diabetes: No Genetic Screening: Normal Maternal Ultrasounds/Referrals: Abnormal:  Findings:   Other:Cleft lip/palate Fetal Ultrasounds or other Referrals:  Referred to Materal Fetal Medicine  Maternal Substance Abuse:  No Significant Maternal Medications:  None other than sythroid Other Comments:  None  ROS    Last menstrual period 04/26/2013. Exam Physical Exam  Cx Cl/ Thick/ High Prenatal labs: ABO, Rh:   Antibody:   Rubella: Immune (05/28 0000) RPR: Nonreactive (05/28 0000)  HBsAg: Negative (05/28 0000)  HIV: Non-reactive (05/28 0000)  GBS:     Assessment/Plan: IUP at 39 weeks Prev Valentine/S x 2 for repeat Fetal cleft lip and palate GBS + Risks and benefits of Valentine/S were discussed.  All questions were answered and informed consent was obtained.  Plan to proceed with low segment transverse Cesarean Section. Michaela Valentine 01/21/2014, 6:36 PM    01/24/14 1030 This patient has been seen and examined.   All of her  questions were answered.  Labs and vital signs reviewed.  Informed consent has been obtained.  The History and Physical is current. DL

## 2014-01-22 ENCOUNTER — Encounter (HOSPITAL_COMMUNITY): Payer: Self-pay

## 2014-01-22 ENCOUNTER — Encounter (HOSPITAL_COMMUNITY)
Admission: RE | Admit: 2014-01-22 | Discharge: 2014-01-22 | Disposition: A | Discharge status: Home / Self Care | Payer: BC Managed Care – PPO | Source: Ambulatory Visit | Attending: Obstetrics and Gynecology | Admitting: Obstetrics and Gynecology

## 2014-01-22 DIAGNOSIS — Z01812 Encounter for preprocedural laboratory examination: Secondary | ICD-10-CM

## 2014-01-22 HISTORY — DX: Headache, unspecified: R51.9

## 2014-01-22 HISTORY — DX: Hypothyroidism, unspecified: E03.9

## 2014-01-22 HISTORY — DX: Headache: R51

## 2014-01-22 LAB — TYPE AND SCREEN
ABO/RH(D): A POS
Antibody Screen: NEGATIVE

## 2014-01-22 LAB — CBC
HEMATOCRIT: 35.6 % — AB (ref 36.0–46.0)
HEMOGLOBIN: 12.2 g/dL (ref 12.0–15.0)
MCH: 28.8 pg (ref 26.0–34.0)
MCHC: 34.3 g/dL (ref 30.0–36.0)
MCV: 84.2 fL (ref 78.0–100.0)
Platelets: 180 10*3/uL (ref 150–400)
RBC: 4.23 MIL/uL (ref 3.87–5.11)
RDW: 13.7 % (ref 11.5–15.5)
WBC: 10.8 10*3/uL — ABNORMAL HIGH (ref 4.0–10.5)

## 2014-01-22 LAB — RPR

## 2014-01-22 LAB — ABO/RH: ABO/RH(D): A POS

## 2014-01-22 NOTE — Patient Instructions (Signed)
Your procedure is scheduled on:01/24/14  Enter through the Main Entrance at :0930 am Pick up desk phone and dial [phone] and inform us of your arrival.  Please call [phone] if you have any problems the morning of surgery.  Remember: Do not eat food after midnight:Friday Clear liquids are ok until:630 am Sat   You may brush your teeth the morning of surgery.  Take these meds the morning of surgery with a sip of water: thyroid pill  DO NOT wear jewelry, eye make-up, lipstick,body lotion, or dark fingernail polish.  (Polished toes are ok) You may wear deodorant.  If you are to be admitted after surgery, leave suitcase in car until your room has been assigned. Patients discharged on the day of surgery will not be allowed to drive home. Wear loose fitting, comfortable clothes for your ride home.

## 2014-01-23 MED ORDER — DEXTROSE 5 % IV SOLN
2.0000 g | INTRAVENOUS | Status: AC
  Administered 2014-01-24: 2 g via INTRAVENOUS
  Filled 2014-01-23: qty 2

## 2014-01-24 ENCOUNTER — Inpatient Hospital Stay (HOSPITAL_COMMUNITY): Payer: BC Managed Care – PPO | Admitting: Anesthesiology

## 2014-01-24 ENCOUNTER — Encounter (HOSPITAL_COMMUNITY)
Admission: RE | Disposition: A | Discharge status: Home / Self Care | Payer: Self-pay | Source: Ambulatory Visit | Attending: Obstetrics and Gynecology

## 2014-01-24 ENCOUNTER — Inpatient Hospital Stay (HOSPITAL_COMMUNITY)
Admission: RE | Admit: 2014-01-24 | Discharge: 2014-01-27 | DRG: 766 | Disposition: A | Discharge status: Home / Self Care | Payer: BC Managed Care – PPO | Source: Ambulatory Visit | Attending: Obstetrics and Gynecology | Admitting: Obstetrics and Gynecology

## 2014-01-24 ENCOUNTER — Encounter (HOSPITAL_COMMUNITY): Payer: Self-pay | Admitting: *Deleted

## 2014-01-24 DIAGNOSIS — O3421 Maternal care for scar from previous cesarean delivery: Secondary | ICD-10-CM | POA: Diagnosis present

## 2014-01-24 DIAGNOSIS — Z3A39 39 weeks gestation of pregnancy: Secondary | ICD-10-CM | POA: Diagnosis present

## 2014-01-24 DIAGNOSIS — O99824 Streptococcus B carrier state complicating childbirth: Secondary | ICD-10-CM | POA: Diagnosis present

## 2014-01-24 DIAGNOSIS — Q379 Unspecified cleft palate with unilateral cleft lip: Secondary | ICD-10-CM | POA: Diagnosis not present

## 2014-01-24 DIAGNOSIS — O358XX0 Maternal care for other (suspected) fetal abnormality and damage, not applicable or unspecified: Secondary | ICD-10-CM | POA: Diagnosis present

## 2014-01-24 DIAGNOSIS — Z3483 Encounter for supervision of other normal pregnancy, third trimester: Secondary | ICD-10-CM | POA: Diagnosis present

## 2014-01-24 SURGERY — Surgical Case
Anesthesia: Spinal | Site: Abdomen

## 2014-01-24 MED ORDER — PROMETHAZINE HCL 25 MG/ML IJ SOLN: 6.2500 mg | INTRAMUSCULAR | Status: DC | PRN

## 2014-01-24 MED ORDER — SCOPOLAMINE 1 MG/3DAYS TD PT72
1.0000 | MEDICATED_PATCH | Freq: Once | TRANSDERMAL | Status: DC
  Administered 2014-01-24: 1.5 mg via TRANSDERMAL

## 2014-01-24 MED ORDER — LEVOTHYROXINE SODIUM 75 MCG PO TABS
75.0000 ug | ORAL_TABLET | Freq: Every day | ORAL | Status: DC
  Administered 2014-01-25 – 2014-01-27 (×3): 75 ug via ORAL
  Filled 2014-01-24 (×3): qty 1

## 2014-01-24 MED ORDER — ONDANSETRON HCL 4 MG/2ML IJ SOLN
INTRAMUSCULAR | Status: AC
  Filled 2014-01-24: qty 2

## 2014-01-24 MED ORDER — ZOLPIDEM TARTRATE 5 MG PO TABS: 5.0000 mg | ORAL_TABLET | Freq: Every evening | ORAL | Status: DC | PRN

## 2014-01-24 MED ORDER — OXYTOCIN 10 UNIT/ML IJ SOLN
INTRAMUSCULAR | Status: AC
  Filled 2014-01-24: qty 4

## 2014-01-24 MED ORDER — MEPERIDINE HCL 25 MG/ML IJ SOLN
6.2500 mg | INTRAMUSCULAR | Status: DC | PRN
Start: 2014-01-24 — End: 2014-01-24

## 2014-01-24 MED ORDER — OXYCODONE-ACETAMINOPHEN 5-325 MG PO TABS
1.0000 | ORAL_TABLET | ORAL | Status: DC | PRN
  Administered 2014-01-25 – 2014-01-27 (×6): 1 via ORAL
  Filled 2014-01-24 (×6): qty 1

## 2014-01-24 MED ORDER — LACTATED RINGERS IV SOLN: INTRAVENOUS | Status: DC

## 2014-01-24 MED ORDER — IBUPROFEN 600 MG PO TABS
600.0000 mg | ORAL_TABLET | Freq: Four times a day (QID) | ORAL | Status: DC
  Administered 2014-01-24 – 2014-01-27 (×10): 600 mg via ORAL
  Filled 2014-01-24 (×10): qty 1

## 2014-01-24 MED ORDER — KETOROLAC TROMETHAMINE 30 MG/ML IJ SOLN
INTRAMUSCULAR | Status: AC
  Administered 2014-01-24: 30 mg via INTRAVENOUS
  Filled 2014-01-24: qty 1

## 2014-01-24 MED ORDER — KETOROLAC TROMETHAMINE 30 MG/ML IJ SOLN: 30.0000 mg | Freq: Four times a day (QID) | INTRAMUSCULAR | Status: AC | PRN

## 2014-01-24 MED ORDER — WITCH HAZEL-GLYCERIN EX PADS: 1.0000 "application " | MEDICATED_PAD | CUTANEOUS | Status: DC | PRN

## 2014-01-24 MED ORDER — SIMETHICONE 80 MG PO CHEW
80.0000 mg | CHEWABLE_TABLET | Freq: Three times a day (TID) | ORAL | Status: DC
  Administered 2014-01-25 – 2014-01-27 (×7): 80 mg via ORAL
  Filled 2014-01-24 (×7): qty 1

## 2014-01-24 MED ORDER — DIPHENHYDRAMINE HCL 50 MG/ML IJ SOLN: 12.5000 mg | INTRAMUSCULAR | Status: DC | PRN

## 2014-01-24 MED ORDER — DIBUCAINE 1 % RE OINT: 1.0000 "application " | TOPICAL_OINTMENT | RECTAL | Status: DC | PRN

## 2014-01-24 MED ORDER — NALBUPHINE HCL 10 MG/ML IJ SOLN: 5.0000 mg | Freq: Once | INTRAMUSCULAR | Status: AC | PRN

## 2014-01-24 MED ORDER — NALOXONE HCL 1 MG/ML IJ SOLN: 1.0000 ug/kg/h | INTRAVENOUS | Status: DC | PRN

## 2014-01-24 MED ORDER — OXYTOCIN 10 UNIT/ML IJ SOLN
40.0000 [IU] | INTRAVENOUS | Status: DC | PRN
  Administered 2014-01-24: 40 [IU] via INTRAVENOUS

## 2014-01-24 MED ORDER — FENTANYL CITRATE 0.05 MG/ML IJ SOLN
INTRAMUSCULAR | Status: DC | PRN
  Administered 2014-01-24 (×2): 50 ug via INTRAVENOUS

## 2014-01-24 MED ORDER — PHENYLEPHRINE HCL 10 MG/ML IJ SOLN
INTRAMUSCULAR | Status: AC
  Filled 2014-01-24: qty 1

## 2014-01-24 MED ORDER — SCOPOLAMINE 1 MG/3DAYS TD PT72
MEDICATED_PATCH | TRANSDERMAL | Status: AC
  Administered 2014-01-24: 1.5 mg via TRANSDERMAL
  Filled 2014-01-24: qty 1

## 2014-01-24 MED ORDER — SODIUM CHLORIDE 0.9 % IJ SOLN: 3.0000 mL | INTRAMUSCULAR | Status: DC | PRN

## 2014-01-24 MED ORDER — PHENYLEPHRINE 8 MG IN D5W 100 ML (0.08MG/ML) PREMIX OPTIME
INJECTION | INTRAVENOUS | Status: DC | PRN
  Administered 2014-01-24: 60 ug/min via INTRAVENOUS

## 2014-01-24 MED ORDER — PRENATAL MULTIVITAMIN CH
1.0000 | ORAL_TABLET | Freq: Every day | ORAL | Status: DC
  Administered 2014-01-25 – 2014-01-26 (×2): 1 via ORAL
  Filled 2014-01-24 (×2): qty 1

## 2014-01-24 MED ORDER — NALBUPHINE HCL 10 MG/ML IJ SOLN: 5.0000 mg | INTRAMUSCULAR | Status: DC | PRN

## 2014-01-24 MED ORDER — SENNOSIDES-DOCUSATE SODIUM 8.6-50 MG PO TABS
2.0000 | ORAL_TABLET | ORAL | Status: DC
  Administered 2014-01-24 – 2014-01-26 (×3): 2 via ORAL
  Filled 2014-01-24 (×3): qty 2

## 2014-01-24 MED ORDER — MORPHINE SULFATE (PF) 0.5 MG/ML IJ SOLN
INTRAMUSCULAR | Status: DC | PRN
  Administered 2014-01-24: .15 mg via INTRATHECAL

## 2014-01-24 MED ORDER — KETOROLAC TROMETHAMINE 30 MG/ML IJ SOLN
30.0000 mg | Freq: Four times a day (QID) | INTRAMUSCULAR | Status: AC | PRN
  Administered 2014-01-24: 30 mg via INTRAVENOUS

## 2014-01-24 MED ORDER — MORPHINE SULFATE 0.5 MG/ML IJ SOLN
INTRAMUSCULAR | Status: AC
  Filled 2014-01-24: qty 10

## 2014-01-24 MED ORDER — NALOXONE HCL 0.4 MG/ML IJ SOLN: 0.4000 mg | INTRAMUSCULAR | Status: DC | PRN

## 2014-01-24 MED ORDER — BUPIVACAINE IN DEXTROSE 0.75-8.25 % IT SOLN
INTRATHECAL | Status: DC | PRN
  Administered 2014-01-24: 1.6 mL via INTRATHECAL

## 2014-01-24 MED ORDER — SIMETHICONE 80 MG PO CHEW: 80.0000 mg | CHEWABLE_TABLET | ORAL | Status: DC | PRN

## 2014-01-24 MED ORDER — MEPERIDINE HCL 25 MG/ML IJ SOLN: 6.2500 mg | INTRAMUSCULAR | Status: DC | PRN

## 2014-01-24 MED ORDER — TETANUS-DIPHTH-ACELL PERTUSSIS 5-2.5-18.5 LF-MCG/0.5 IM SUSP: 0.5000 mL | Freq: Once | INTRAMUSCULAR | Status: DC

## 2014-01-24 MED ORDER — SCOPOLAMINE 1 MG/3DAYS TD PT72: 1.0000 | MEDICATED_PATCH | Freq: Once | TRANSDERMAL | Status: DC

## 2014-01-24 MED ORDER — FENTANYL CITRATE 0.05 MG/ML IJ SOLN
INTRAMUSCULAR | Status: AC
  Filled 2014-01-24: qty 2

## 2014-01-24 MED ORDER — DIPHENHYDRAMINE HCL 25 MG PO CAPS: 25.0000 mg | ORAL_CAPSULE | ORAL | Status: DC | PRN

## 2014-01-24 MED ORDER — DIPHENHYDRAMINE HCL 25 MG PO CAPS: 25.0000 mg | ORAL_CAPSULE | Freq: Four times a day (QID) | ORAL | Status: DC | PRN

## 2014-01-24 MED ORDER — ONDANSETRON HCL 4 MG/2ML IJ SOLN
INTRAMUSCULAR | Status: DC | PRN
  Administered 2014-01-24: 4 mg via INTRAVENOUS

## 2014-01-24 MED ORDER — ONDANSETRON HCL 4 MG/2ML IJ SOLN
4.0000 mg | Freq: Three times a day (TID) | INTRAMUSCULAR | Status: DC | PRN
Start: 2014-01-24 — End: 2014-01-27

## 2014-01-24 MED ORDER — OXYCODONE-ACETAMINOPHEN 5-325 MG PO TABS: 2.0000 | ORAL_TABLET | ORAL | Status: DC | PRN

## 2014-01-24 MED ORDER — ONDANSETRON HCL 4 MG PO TABS
4.0000 mg | ORAL_TABLET | ORAL | Status: DC | PRN
Start: 2014-01-24 — End: 2014-01-27

## 2014-01-24 MED ORDER — LACTATED RINGERS IV SOLN
INTRAVENOUS | Status: DC | PRN
  Administered 2014-01-24: 13:00:00 via INTRAVENOUS

## 2014-01-24 MED ORDER — ONDANSETRON HCL 4 MG/2ML IJ SOLN: 4.0000 mg | INTRAMUSCULAR | Status: DC | PRN

## 2014-01-24 MED ORDER — LANOLIN HYDROUS EX OINT: 1.0000 "application " | TOPICAL_OINTMENT | CUTANEOUS | Status: DC | PRN

## 2014-01-24 MED ORDER — OXYTOCIN 40 UNITS IN LACTATED RINGERS INFUSION - SIMPLE MED: 62.5000 mL/h | INTRAVENOUS | Status: AC

## 2014-01-24 MED ORDER — HYDROMORPHONE HCL 1 MG/ML IJ SOLN: 0.2500 mg | INTRAMUSCULAR | Status: DC | PRN

## 2014-01-24 MED ORDER — LACTATED RINGERS IV SOLN
INTRAVENOUS | Status: DC
  Administered 2014-01-24: 125 mL/h via INTRAVENOUS
  Administered 2014-01-24: 13:00:00 via INTRAVENOUS

## 2014-01-24 MED ORDER — SIMETHICONE 80 MG PO CHEW
80.0000 mg | CHEWABLE_TABLET | ORAL | Status: DC
  Administered 2014-01-24 – 2014-01-26 (×3): 80 mg via ORAL
  Filled 2014-01-24 (×3): qty 1

## 2014-01-24 MED ORDER — MENTHOL 3 MG MT LOZG: 1.0000 | LOZENGE | OROMUCOSAL | Status: DC | PRN

## 2014-01-24 SURGICAL SUPPLY — 27 items
CLAMP CORD UMBIL (MISCELLANEOUS) IMPLANT
CLOTH BEACON ORANGE TIMEOUT ST (SAFETY) ×3 IMPLANT
DRAPE SHEET LG 3/4 BI-LAMINATE (DRAPES) IMPLANT
DRSG OPSITE POSTOP 4X10 (GAUZE/BANDAGES/DRESSINGS) ×3 IMPLANT
DURAPREP 26ML APPLICATOR (WOUND CARE) ×3 IMPLANT
ELECT REM PT RETURN 9FT ADLT (ELECTROSURGICAL) ×3
ELECTRODE REM PT RTRN 9FT ADLT (ELECTROSURGICAL) ×1 IMPLANT
EXTRACTOR VACUUM M CUP 4 TUBE (SUCTIONS) ×1 IMPLANT
EXTRACTOR VACUUM M CUP 4' TUBE (SUCTIONS) ×1
GLOVE SURG ORTHO 8.0 STRL STRW (GLOVE) ×3 IMPLANT
GOWN STRL REUS W/TWL LRG LVL3 (GOWN DISPOSABLE) ×6 IMPLANT
KIT ABG SYR 3ML LUER SLIP (SYRINGE) ×3 IMPLANT
NDL HYPO 25X5/8 SAFETYGLIDE (NEEDLE) ×1 IMPLANT
NEEDLE HYPO 25X5/8 SAFETYGLIDE (NEEDLE) ×3 IMPLANT
NS IRRIG 1000ML POUR BTL (IV SOLUTION) ×3 IMPLANT
PACK C SECTION WH (CUSTOM PROCEDURE TRAY) ×3 IMPLANT
PAD OB MATERNITY 4.3X12.25 (PERSONAL CARE ITEMS) ×3 IMPLANT
SUT MNCRL 0 VIOLET CTX 36 (SUTURE) ×3 IMPLANT
SUT MON AB 4-0 PS1 27 (SUTURE) ×3 IMPLANT
SUT MONOCRYL 0 CTX 36 (SUTURE) ×6
SUT PDS AB 1 CT  36 (SUTURE)
SUT PDS AB 1 CT 36 (SUTURE) IMPLANT
SUT VIC AB 1 CTX 36 (SUTURE)
SUT VIC AB 1 CTX36XBRD ANBCTRL (SUTURE) IMPLANT
TOWEL OR 17X24 6PK STRL BLUE (TOWEL DISPOSABLE) ×3 IMPLANT
TRAY FOLEY CATH 14FR (SET/KITS/TRAYS/PACK) ×3 IMPLANT
WATER STERILE IRR 1000ML POUR (IV SOLUTION) ×3 IMPLANT

## 2014-01-24 NOTE — Transfer of Care (Signed)
Immediate Anesthesia Transfer of Care Note  Patient: Michaela Valentine  Procedure(s) Performed: Procedure(s) with comments: CESAREAN SECTION (N/A) - edc 01/31/14  Patient Location: PACU  Anesthesia Type:Spinal  Level of Consciousness: awake, alert  and oriented  Airway & Oxygen Therapy: Patient Spontanous Breathing  Post-op Assessment: Report given to PACU RN and Post -op Vital signs reviewed and stable  Post vital signs: Reviewed and stable  Complications: No apparent anesthesia complications

## 2014-01-24 NOTE — Anesthesia Postprocedure Evaluation (Signed)
Anesthesia Post Note  Patient: Michaela Valentine  Procedure(s) Performed: Procedure(s) (LRB): CESAREAN SECTION (N/A)  Anesthesia type: Spinal  Patient location: PACU  Post pain: Pain level controlled  Post assessment: Post-op Vital signs reviewed  Last Vitals: BP 93/57 mmHg  Pulse 82  Temp(Src) 36.7 C (Oral)  Resp 17  Ht 5\' 6"  (1.676 m)  Wt 218 lb (98.884 kg)  BMI 35.20 kg/m2  SpO2 98%  LMP 04/26/2013  Breastfeeding? Unknown  Post vital signs: Reviewed  Level of consciousness: sedated  Complications: No apparent anesthesia complications

## 2014-01-24 NOTE — Anesthesia Procedure Notes (Signed)
Spinal Patient location during procedure: OR Staffing Anesthesiologist: Kellan Raffield R Performed by: anesthesiologist  Preanesthetic Checklist Completed: patient identified, site marked, surgical consent, pre-op evaluation, timeout performed, IV checked, risks and benefits discussed and monitors and equipment checked Spinal Block Patient position: sitting Prep: Betadine Patient monitoring: heart rate, continuous pulse ox and blood pressure Approach: midline Location: L3-4 Injection technique: single-shot Needle Needle type: Sprotte  Needle gauge: 24 G Needle length: 9 cm Assessment Sensory level: T8 Additional Notes Expiration date of kit checked and confirmed. Patient tolerated procedure well, without complications.     

## 2014-01-24 NOTE — Op Note (Signed)
Cesarean Section Procedure Note  Pre-operative Diagnosis: IUP at 39 weeks, prev C/S x 2 for repeat, fetal cleft lip/palate  Post-operative Diagnosis: same  Surgeon: Turner DanielsLOWE,Terilyn Sano C   Assistants: none  Anesthesia:spinal  Procedure:  Low Segment Transverse cesarean section  Procedure Details  The patient was seen in the Holding Room. The risks, benefits, complications, treatment options, and expected outcomes were discussed with the patient.  The patient concurred with the proposed plan, giving informed consent.  The site of surgery properly noted/marked.. A Time Out was held and the above information confirmed.  After induction of anesthesia, the patient was draped and prepped in the usual sterile manner. A Pfannenstiel incision was made and carried down through the subcutaneous tissue to the fascia. Fascial incision was made and extended transversely. The fascia was separated from the underlying rectus tissue superiorly and inferiorly. The peritoneum was identified and entered. Peritoneal incision was extended longitudinally. The utero-vesical peritoneal reflection was incised transversely and the bladder flap was bluntly freed from the lower uterine segment. A low transverse uterine incision was made. Delivered from vertex presentation with one pull VE was a baby with Apgar scores of 9 at one minute and 9 at five minutes. After the umbilical cord was clamped and cut cord blood was obtained for evaluation. The placenta was removed intact and appeared normal. The uterine outline, tubes and ovaries appeared normal. The uterine incision was closed with running locked sutures of 0 monocryl and imbricated with 0 monocryl. Hemostasis was observed. Lavage was carried out until clear. The peritoneum was then closed with 0 monocryl and rectus muscles plicated in the midline.  After hemostasis was assured, the fascia was then reapproximated with running sutures of 0 PDS. Campers fascia reapproximated with 0  plain suture. Irrigation was applied and after adequate hemostasis was assured, the skin was reapproximated with sub q 4-0 monocryl..  Instrument, sponge, and needle counts were correct prior the abdominal closure and at the conclusion of the case. The patient received 2 grams cefotetan preoperatively.  Findings: Viable female, fetal cleft lip  Estimated Blood Loss:  500cc         Specimens: Placenta was sent to L&D         Complications:  None

## 2014-01-24 NOTE — Addendum Note (Signed)
Addendum  created 01/24/14 2334 by Gaylan GeroldJohn R Syler Norcia, MD   Modules edited: Anesthesia Blocks and Procedures, Clinical Notes   Clinical Notes:  File: [id number]

## 2014-01-24 NOTE — Anesthesia Preprocedure Evaluation (Signed)
Anesthesia Evaluation  Patient identified by MRN, date of birth, ID band Patient awake    Reviewed: Allergy & Precautions, H&P , NPO status , Patient's Chart, lab work & pertinent test results  Airway Mallampati: II  TM Distance: >3 FB Neck ROM: full    Dental no notable dental hx.    Pulmonary neg pulmonary ROS,  breath sounds clear to auscultation  Pulmonary exam normal       Cardiovascular Exercise Tolerance: Good negative cardio ROS  Rhythm:regular Rate:Normal     Neuro/Psych negative neurological ROS  negative psych ROS   GI/Hepatic negative GI ROS, Neg liver ROS,   Endo/Other  Hypothyroidism Morbid obesity  Renal/GU negative Renal ROS  negative genitourinary   Musculoskeletal negative musculoskeletal ROS (+)   Abdominal   Peds negative pediatric ROS (+)  Hematology negative hematology ROS (+)   Anesthesia Other Findings   Reproductive/Obstetrics negative OB ROS                             Anesthesia Physical  Anesthesia Plan  ASA: III  Anesthesia Plan: Spinal   Post-op Pain Management:    Induction:   Airway Management Planned:   Additional Equipment:   Intra-op Plan:   Post-operative Plan:   Informed Consent: I have reviewed the patients History and Physical, chart, labs and discussed the procedure including the risks, benefits and alternatives for the proposed anesthesia with the patient or authorized representative who has indicated his/her understanding and acceptance.   Dental advisory given  Plan Discussed with: CRNA  Anesthesia Plan Comments: ( )        Anesthesia Quick Evaluation

## 2014-01-25 LAB — CBC
HCT: 28.8 % — ABNORMAL LOW (ref 36.0–46.0)
HEMOGLOBIN: 9.8 g/dL — AB (ref 12.0–15.0)
MCH: 29 pg (ref 26.0–34.0)
MCHC: 34 g/dL (ref 30.0–36.0)
MCV: 85.2 fL (ref 78.0–100.0)
Platelets: 132 10*3/uL — ABNORMAL LOW (ref 150–400)
RBC: 3.38 MIL/uL — AB (ref 3.87–5.11)
RDW: 13.5 % (ref 11.5–15.5)
WBC: 9.2 10*3/uL (ref 4.0–10.5)

## 2014-01-25 NOTE — Progress Notes (Signed)
Subjective: Postpartum Day 1: Cesarean Delivery Patient reports tolerating PO and no problems voiding.    Objective: Vital signs in last 24 hours: Temp:  [98 F (36.7 C)-98.8 F (37.1 C)] 98 F (36.7 C) (12/27 0524) Pulse Rate:  [68-84] 68 (12/27 0524) Resp:  [16-22] 18 (12/27 0524) BP: (81-145)/(42-95) 90/50 mmHg (12/27 0524) SpO2:  [96 %-100 %] 98 % (12/27 0524) Weight:  [218 lb (98.884 kg)] 218 lb (98.884 kg) (12/26 0945)  Physical Exam:  General: alert, cooperative, appears stated age and no distress Lochia: appropriate Uterine Fundus: firm Incision: healing well DVT Evaluation: No evidence of DVT seen on physical exam.   Recent Labs  01/22/14 0920 01/25/14 0519  HGB 12.2 9.8*  HCT 35.6* 28.8*    Assessment/Plan: Status post Cesarean section. Doing well postoperatively.  Continue current care Baby in NICU with fetal cleft lip/palate due to breathing issues .  Michaela Valentine C 01/25/2014, 9:19 AM

## 2014-01-25 NOTE — Addendum Note (Signed)
Addendum  created 01/25/14 [id number] by Turner DanielsJennifer L Estuardo Frisbee, CRNA   Modules edited: Notes Section   Notes Section:  File: [id number]

## 2014-01-25 NOTE — Anesthesia Postprocedure Evaluation (Signed)
  Anesthesia Post-op Note  Anesthesia Post Note  Patient: Michaela Valentine  Procedure(s) Performed: Procedure(s) (LRB): CESAREAN SECTION (N/A)  Anesthesia type: Spinal  Patient location: Mother/Baby  Post pain: Pain level controlled  Post assessment: Post-op Vital signs reviewed  Last Vitals:  Filed Vitals:   01/25/14 0524  BP: 90/50  Pulse: 68  Temp: 36.7 C  Resp: 18    Post vital signs: Reviewed  Level of consciousness: awake  Complications: No apparent anesthesia complications

## 2014-01-26 ENCOUNTER — Encounter (HOSPITAL_COMMUNITY): Payer: Self-pay | Admitting: Obstetrics and Gynecology

## 2014-01-26 LAB — CBC
HCT: 27.5 % — ABNORMAL LOW (ref 36.0–46.0)
Hemoglobin: 9.4 g/dL — ABNORMAL LOW (ref 12.0–15.0)
MCH: 29 pg (ref 26.0–34.0)
MCHC: 34.2 g/dL (ref 30.0–36.0)
MCV: 84.9 fL (ref 78.0–100.0)
Platelets: 150 10*3/uL (ref 150–400)
RBC: 3.24 MIL/uL — AB (ref 3.87–5.11)
RDW: 13.6 % (ref 11.5–15.5)
WBC: 6.5 10*3/uL (ref 4.0–10.5)

## 2014-01-26 NOTE — Progress Notes (Signed)
Subjective: Postpartum Day 2: Cesarean Delivery Patient reports tolerating PO and no problems voiding.  Baby in NICU for cleft lip/palate.  Objective: Vital signs in last 24 hours: Temp:  [97.7 F (36.5 C)-98.5 F (36.9 C)] 97.7 F (36.5 C) (12/28 0539) Pulse Rate:  [74-81] 74 (12/28 0539) Resp:  [16-18] 16 (12/28 0539) BP: (102-107)/(57-68) 107/57 mmHg (12/28 0539) SpO2:  [98 %-100 %] 98 % (12/28 0539)  Physical Exam:  General: alert, cooperative and appears stated age Lochia: appropriate Uterine Fundus: firm Incision: healing well, no significant drainage, no dehiscence DVT Evaluation: No evidence of DVT seen on physical exam. Negative Homan's sign. No cords or calf tenderness.   Recent Labs  01/25/14 0519 01/26/14 0531  HGB 9.8* 9.4*  HCT 28.8* 27.5*    Assessment/Plan: Status post Cesarean section. Doing well postoperatively.  Continue current care.  Brandilee Pies 01/26/2014, 8:52 AM

## 2014-01-26 NOTE — Progress Notes (Signed)
Ur chart review completed.  

## 2014-01-27 MED ORDER — OXYCODONE-ACETAMINOPHEN 7.5-325 MG PO TABS: 1.0000 | ORAL_TABLET | ORAL | Status: AC | PRN

## 2014-01-27 NOTE — Progress Notes (Signed)
Pt discharged home with husband... Discharge instructions reviewed with pt and she verbalized understanding... Condition stable... No equipment... Ambulated to car with C. Riley, NT.  

## 2014-01-27 NOTE — Progress Notes (Signed)
Pt complaining of headache. States she spoke with Dr. Rana SnareLowe about it yesterday and she said he said it was probably related to lack of sleep. Will continue to monitor pt.

## 2014-01-27 NOTE — Discharge Summary (Signed)
Obstetric Discharge Summary Reason for Admission: cesarean section Prenatal Procedures: ultrasound Intrapartum Procedures: cesarean: low cervical, transverse Postpartum Procedures: none Complications-Operative and Postpartum: none HEMOGLOBIN  Date Value Ref Range Status  01/26/2014 9.4* 12.0 - 15.0 g/dL Final   HCT  Date Value Ref Range Status  01/26/2014 27.5* 36.0 - 46.0 % Final    Physical Exam:  General: alert Lochia: appropriate Uterine Fundus: firm Incision: healing well DVT Evaluation: No evidence of DVT seen on physical exam.  Discharge Diagnoses: Term Pregnancy-delivered  Discharge Information: Date: 01/27/2014 Activity: pelvic rest Diet: routine Medications: PNV and Percocet Condition: stable Instructions: refer to practice specific booklet Discharge to: home   Newborn Data: Live born female  Birth Weight: 7 lb 5.1 oz (3320 g) APGAR: 9, 9  Home with mother.  Airyn Ellzey S 01/27/2014, 7:55 AM

## 2014-03-04 ENCOUNTER — Other Ambulatory Visit: Payer: Self-pay | Admitting: Obstetrics and Gynecology

## 2014-09-14 ENCOUNTER — Other Ambulatory Visit: Payer: Self-pay | Admitting: Obstetrics and Gynecology

## 2014-09-15 LAB — CYTOLOGY - PAP

## 2014-11-02 ENCOUNTER — Emergency Department (HOSPITAL_COMMUNITY): Payer: BLUE CROSS/BLUE SHIELD

## 2014-11-02 ENCOUNTER — Emergency Department (HOSPITAL_COMMUNITY)
Admission: EM | Admit: 2014-11-02 | Discharge: 2014-11-02 | Disposition: A | Discharge status: Home / Self Care | Payer: BLUE CROSS/BLUE SHIELD | Attending: Emergency Medicine | Admitting: Emergency Medicine

## 2014-11-02 ENCOUNTER — Encounter (HOSPITAL_COMMUNITY): Payer: Self-pay | Admitting: Emergency Medicine

## 2014-11-02 DIAGNOSIS — Z3202 Encounter for pregnancy test, result negative: Secondary | ICD-10-CM | POA: Insufficient documentation

## 2014-11-02 DIAGNOSIS — E039 Hypothyroidism, unspecified: Secondary | ICD-10-CM | POA: Diagnosis not present

## 2014-11-02 DIAGNOSIS — K2970 Gastritis, unspecified, without bleeding: Secondary | ICD-10-CM | POA: Insufficient documentation

## 2014-11-02 DIAGNOSIS — R1010 Upper abdominal pain, unspecified: Secondary | ICD-10-CM | POA: Diagnosis present

## 2014-11-02 DIAGNOSIS — R1013 Epigastric pain: Secondary | ICD-10-CM

## 2014-11-02 DIAGNOSIS — Z79899 Other long term (current) drug therapy: Secondary | ICD-10-CM | POA: Insufficient documentation

## 2014-11-02 LAB — COMPREHENSIVE METABOLIC PANEL
ALK PHOS: 33 U/L — AB (ref 38–126)
ALT: 14 U/L (ref 14–54)
ANION GAP: 9 (ref 5–15)
AST: 19 U/L (ref 15–41)
Albumin: 4.4 g/dL (ref 3.5–5.0)
BUN: 7 mg/dL (ref 6–20)
CO2: 21 mmol/L — ABNORMAL LOW (ref 22–32)
Calcium: 9.9 mg/dL (ref 8.9–10.3)
Chloride: 109 mmol/L (ref 101–111)
Creatinine, Ser: 0.72 mg/dL (ref 0.44–1.00)
Glucose, Bld: 126 mg/dL — ABNORMAL HIGH (ref 65–99)
POTASSIUM: 4.2 mmol/L (ref 3.5–5.1)
Sodium: 139 mmol/L (ref 135–145)
Total Bilirubin: 0.8 mg/dL (ref 0.3–1.2)
Total Protein: 7 g/dL (ref 6.5–8.1)

## 2014-11-02 LAB — URINALYSIS, ROUTINE W REFLEX MICROSCOPIC
BILIRUBIN URINE: NEGATIVE
Glucose, UA: NEGATIVE mg/dL
Ketones, ur: 40 mg/dL — AB
Leukocytes, UA: NEGATIVE
Nitrite: NEGATIVE
Protein, ur: NEGATIVE mg/dL
Specific Gravity, Urine: 1.026 (ref 1.005–1.030)
UROBILINOGEN UA: 0.2 mg/dL (ref 0.0–1.0)
pH: 5 (ref 5.0–8.0)

## 2014-11-02 LAB — CBC WITH DIFFERENTIAL/PLATELET
BASOS PCT: 0 %
Basophils Absolute: 0 10*3/uL (ref 0.0–0.1)
Eosinophils Absolute: 0 10*3/uL (ref 0.0–0.7)
Eosinophils Relative: 0 %
HEMATOCRIT: 47 % — AB (ref 36.0–46.0)
HEMOGLOBIN: 16.2 g/dL — AB (ref 12.0–15.0)
Lymphocytes Relative: 6 %
Lymphs Abs: 0.8 10*3/uL (ref 0.7–4.0)
MCH: 28.5 pg (ref 26.0–34.0)
MCHC: 34.5 g/dL (ref 30.0–36.0)
MCV: 82.6 fL (ref 78.0–100.0)
Monocytes Absolute: 0.6 10*3/uL (ref 0.1–1.0)
Monocytes Relative: 4 %
NEUTROS ABS: 12.3 10*3/uL — AB (ref 1.7–7.7)
NEUTROS PCT: 90 %
Platelets: 250 10*3/uL (ref 150–400)
RBC: 5.69 MIL/uL — ABNORMAL HIGH (ref 3.87–5.11)
RDW: 12.8 % (ref 11.5–15.5)
WBC: 13.7 10*3/uL — ABNORMAL HIGH (ref 4.0–10.5)

## 2014-11-02 LAB — URINE MICROSCOPIC-ADD ON

## 2014-11-02 LAB — POC URINE PREG, ED: Preg Test, Ur: NEGATIVE

## 2014-11-02 LAB — LIPASE, BLOOD: LIPASE: 21 U/L — AB (ref 22–51)

## 2014-11-02 MED ORDER — IOHEXOL 300 MG/ML  SOLN
100.0000 mL | Freq: Once | INTRAMUSCULAR | Status: AC | PRN
  Administered 2014-11-02: 100 mL via INTRAVENOUS

## 2014-11-02 MED ORDER — OMEPRAZOLE 20 MG PO CPDR: 20.0000 mg | DELAYED_RELEASE_CAPSULE | Freq: Every day | ORAL | Status: AC

## 2014-11-02 MED ORDER — MORPHINE SULFATE (PF) 4 MG/ML IV SOLN
4.0000 mg | Freq: Once | INTRAVENOUS | Status: AC
  Administered 2014-11-02: 4 mg via INTRAVENOUS
  Filled 2014-11-02: qty 1

## 2014-11-02 MED ORDER — SODIUM CHLORIDE 0.9 % IV BOLUS (SEPSIS)
1000.0000 mL | Freq: Once | INTRAVENOUS | Status: AC
  Administered 2014-11-02: 1000 mL via INTRAVENOUS

## 2014-11-02 MED ORDER — PANTOPRAZOLE SODIUM 40 MG IV SOLR
40.0000 mg | Freq: Once | INTRAVENOUS | Status: AC
  Administered 2014-11-02: 40 mg via INTRAVENOUS
  Filled 2014-11-02: qty 40

## 2014-11-02 MED ORDER — ONDANSETRON HCL 4 MG/2ML IJ SOLN
4.0000 mg | Freq: Once | INTRAMUSCULAR | Status: AC
  Administered 2014-11-02: 4 mg via INTRAVENOUS
  Filled 2014-11-02: qty 2

## 2014-11-02 MED ORDER — IOHEXOL 300 MG/ML  SOLN
25.0000 mL | Freq: Once | INTRAMUSCULAR | Status: AC | PRN
  Administered 2014-11-02: 25 mL via ORAL

## 2014-11-02 MED ORDER — SUCRALFATE 1 GM/10ML PO SUSP: 1.0000 g | Freq: Three times a day (TID) | ORAL | Status: AC

## 2014-11-02 NOTE — ED Notes (Signed)
Called CT, st's they will send for pt.

## 2014-11-02 NOTE — Discharge Instructions (Signed)
Abdominal Pain °Many things can cause abdominal pain. Usually, abdominal pain is not caused by a disease and will improve without treatment. It can often be observed and treated at home. Your health care provider will do a physical exam and possibly order blood tests and X-rays to help determine the seriousness of your pain. However, in many cases, more time must pass before a clear cause of the pain can be found. Before that point, your health care provider may not know if you need more testing or further treatment. °HOME CARE INSTRUCTIONS  °Monitor your abdominal pain for any changes. The following actions may help to alleviate any discomfort you are experiencing: °· Only take over-the-counter or prescription medicines as directed by your health care provider. °· Do not take laxatives unless directed to do so by your health care provider. °· Try a clear liquid diet (broth, tea, or water) as directed by your health care provider. Slowly move to a bland diet as tolerated. °SEEK MEDICAL CARE IF: °· You have unexplained abdominal pain. °· You have abdominal pain associated with nausea or diarrhea. °· You have pain when you urinate or have a bowel movement. °· You experience abdominal pain that wakes you in the night. °· You have abdominal pain that is worsened or improved by eating food. °· You have abdominal pain that is worsened with eating fatty foods. °· You have a fever. °SEEK IMMEDIATE MEDICAL CARE IF:  °· Your pain does not go away within 2 hours. °· You keep throwing up (vomiting). °· Your pain is felt only in portions of the abdomen, such as the right side or the left lower portion of the abdomen. °· You pass bloody or black tarry stools. °MAKE SURE YOU: °· Understand these instructions.   °· Will watch your condition.   °· Will get help right away if you are not doing well or get worse.   °Document Released: 10/26/2004 Document Revised: 01/21/2013 Document Reviewed: 09/25/2012 °[employer]® Patient Information  ©2015 [employer]. This information is not intended to replace advice given to you by your health care provider. Make sure you discuss any questions you have with your health care provider. ° °Gastritis, Adult °Gastritis is soreness and swelling (inflammation) of the lining of the stomach. Gastritis can develop as a sudden onset (acute) or long-term (chronic) condition. If gastritis is not treated, it can lead to stomach bleeding and ulcers. °CAUSES  °Gastritis occurs when the stomach lining is weak or damaged. Digestive juices from the stomach then inflame the weakened stomach lining. The stomach lining may be weak or damaged due to viral or bacterial infections. One common bacterial infection is the Helicobacter pylori infection. Gastritis can also result from excessive alcohol consumption, taking certain medicines, or having too much acid in the stomach.  °SYMPTOMS  °In some cases, there are no symptoms. When symptoms are present, they may include: °· Pain or a burning sensation in the upper abdomen. °· Nausea. °· Vomiting. °· An uncomfortable feeling of fullness after eating. °DIAGNOSIS  °Your caregiver may suspect you have gastritis based on your symptoms and a physical exam. To determine the cause of your gastritis, your caregiver may perform the following: °· Blood or stool tests to check for the H pylori bacterium. °· Gastroscopy. A thin, flexible tube (endoscope) is passed down the esophagus and into the stomach. The endoscope has a light and camera on the end. Your caregiver uses the endoscope to view the inside of the stomach. °· Taking a tissue sample (biopsy)   from the stomach to examine under a microscope. °TREATMENT  °Depending on the cause of your gastritis, medicines may be prescribed. If you have a bacterial infection, such as an H pylori infection, antibiotics may be given. If your gastritis is caused by too much acid in the stomach, H2 blockers or antacids may be given. Your caregiver may  recommend that you stop taking aspirin, ibuprofen, or other nonsteroidal anti-inflammatory drugs (NSAIDs). °HOME CARE INSTRUCTIONS °· Only take over-the-counter or prescription medicines as directed by your caregiver. °· If you were given antibiotic medicines, take them as directed. Finish them even if you start to feel better. °· Drink enough fluids to keep your urine clear or pale yellow. °· Avoid foods and drinks that make your symptoms worse, such as: °¨ Caffeine or alcoholic drinks. °¨ Chocolate. °¨ Peppermint or mint flavorings. °¨ Garlic and onions. °¨ Spicy foods. °¨ Citrus fruits, such as oranges, lemons, or limes. °¨ Tomato-based foods such as sauce, chili, salsa, and pizza. °¨ Fried and fatty foods. °· Eat small, frequent meals instead of large meals. °SEEK IMMEDIATE MEDICAL CARE IF:  °· You have black or dark red stools. °· You vomit blood or material that looks like coffee grounds. °· You are unable to keep fluids down. °· Your abdominal pain gets worse. °· You have a fever. °· You do not feel better after 1 week. °· You have any other questions or concerns. °MAKE SURE YOU: °· Understand these instructions. °· Will watch your condition. °· Will get help right away if you are not doing well or get worse. °Document Released: 01/10/2001 Document Revised: 07/18/2011 Document Reviewed: 03/01/2011 °ExitCare® Patient Information ©2015 [employer]. This information is not intended to replace advice given to you by your health care provider. Make sure you discuss any questions you have with your health care provider. ° °

## 2014-11-02 NOTE — ED Notes (Signed)
Patient attempted to void, unable to obtain urine sample at this time.

## 2014-11-02 NOTE — ED Provider Notes (Signed)
CSN: [id number]     Arrival date & time 11/02/14  1016 History   First MD Initiated Contact with Patient 11/02/14 1048     Chief Complaint  Patient presents with  . Abdominal Pain     (Consider location/radiation/quality/duration/timing/severity/associated sxs/prior Treatment) HPI Patient states she's been having upper abdominal pain for the past week. It had been waxing and waning in severity. It was mostly burning quality in her epigastric area. She could not identify any specific foods that seem to be triggering it. After the first several days it seemed to ease off somewhat. This morning it awakened her out of her sleep and has been intensely painful with associated nausea. She vomited once prior to arrival and once after arrival to the emergency department. She denies radiation of pain. There is some increased pain with movement. She denies pain burning or urgency urination. She denies vaginal discharge. Patient reports that she has an IUD and very low likelihood of any pregnancy. Past Medical History  Diagnosis Date  . No pertinent past medical history   . Headache     migraines with aura  . Hypothyroidism    Past Surgical History  Procedure Laterality Date  . Cesarean section    . Cesarean section  12/29/2010    Procedure: CESAREAN SECTION;  Surgeon: Turner Daniels, MD;  Location: [facility];  Service: Gynecology;  Laterality: N/A;  repeat  . Colposcopy    . Sphenoid sinus cyst removed    . Cesarean section N/A 01/24/2014    Procedure: CESAREAN SECTION;  Surgeon: Turner Daniels, MD;  Location: [facility];  Service: Obstetrics;  Laterality: N/A;  edc 01/31/14   No family history on file. Social History  Substance Use Topics  . Smoking status: Never Smoker   . Smokeless tobacco: Never Used  . Alcohol Use: No   OB History    Gravida Para Term Preterm AB TAB SAB Ectopic Multiple Living   0 2     Review of Systems  10 Systems reviewed and are negative for acute change except  as noted in the HPI.  Allergies  Review of patient's allergies indicates no known allergies.  Home Medications   Prior to Admission medications   Medication Sig Start Date End Date Taking? Authorizing Provider  ibuprofen (ADVIL,MOTRIN) 200 MG tablet Take 600 mg by mouth every 6 (six) hours as needed for moderate pain.   Yes Historical Provider, MD  levothyroxine (SYNTHROID, LEVOTHROID) 75 MCG tablet Take 75 mcg by mouth daily before breakfast.   Yes Historical Provider, MD  omeprazole (PRILOSEC) 20 MG capsule Take 1 capsule (20 mg total) by mouth daily. 11/02/14   Arby Barrette, MD  oxyCODONE-acetaminophen (PERCOCET) 7.5-325 MG per tablet Take 1 tablet by mouth every 4 (four) hours as needed for pain. Patient not taking: Reported on 11/02/2014 01/27/14   Richardean Chimera, MD  sucralfate (CARAFATE) 1 GM/10ML suspension Take 10 mLs (1 g total) by mouth 4 (four) times daily -  with meals and at bedtime. 11/02/14   Arby Barrette, MD   BP 104/67 mmHg  Pulse 79  Temp(Src) 97.9 F (36.6 C) (Oral)  Resp 16  Ht  (1.676 m)  Wt 200 lb (90.719 kg)  BMI 32.30 kg/m2  SpO2 100%  LMP 11/02/2014 Physical Exam  Constitutional: She is oriented to person, place, and time. She appears well-developed and well-nourished.  Patient is a moderate pain. She is alert and nontoxic.  HENT:  Head: Normocephalic and atraumatic.  Eyes: EOM are normal. Pupils are equal, round, and reactive to light.  Neck: Neck supple.  Cardiovascular: Normal rate, regular rhythm, normal heart sounds and intact distal pulses.   Pulmonary/Chest: Effort normal and breath sounds normal.  Abdominal: Soft. Bowel sounds are normal. She exhibits no distension. There is tenderness.  Epigastrium and right upper quadrant tenderness palpation.  Musculoskeletal: Normal range of motion. She exhibits no edema or tenderness.  Neurological: She is alert and oriented to person, place, and time. She has normal strength. She exhibits normal muscle  tone. Coordination normal. GCS eye subscore is 4. GCS verbal subscore is 5. GCS motor subscore is 6.  Skin: Skin is warm, dry and intact.  Psychiatric: She has a normal mood and affect.    ED Course  Procedures (including critical care time) Labs Review Labs Reviewed  COMPREHENSIVE METABOLIC PANEL - Abnormal; Notable for the following:    CO2 21 (*)    Glucose, Bld 126 (*)    Alkaline Phosphatase 33 (*)    All other components within normal limits  LIPASE, BLOOD - Abnormal; Notable for the following:    Lipase 21 (*)    All other components within normal limits  CBC WITH DIFFERENTIAL/PLATELET - Abnormal; Notable for the following:    WBC 13.7 (*)    RBC 5.69 (*)    Hemoglobin 16.2 (*)    HCT 47.0 (*)    Neutro Abs 12.3 (*)    All other components within normal limits  URINALYSIS, ROUTINE W REFLEX MICROSCOPIC (NOT AT [facility]) - Abnormal; Notable for the following:    Hgb urine dipstick TRACE (*)    Ketones, ur 40 (*)    All other components within normal limits  URINE MICROSCOPIC-ADD ON - Abnormal; Notable for the following:    Squamous Epithelial / LPF FEW (*)    Bacteria, UA FEW (*)    Casts HYALINE CASTS (*)    All other components within normal limits  POC URINE PREG, ED    Imaging Review Ct Abdomen Pelvis W Contrast  11/02/2014   CLINICAL DATA:  Abdominal pain for 1 week worse since 0200 hours, epigastric burning, vomiting, leukocytosis  EXAM: CT ABDOMEN AND PELVIS WITH CONTRAST  TECHNIQUE: Multidetector CT imaging of the abdomen and pelvis was performed using the standard protocol following bolus administration of intravenous contrast. Sagittal and coronal MPR images reconstructed from axial data set.  CONTRAST:  OMNIPAQUE IOHEXOL 300 MG/ML  SOLN  COMPARISON:  None.  FINDINGS: Lung bases clear.  Nonspecific 8 mm low-attenuation focus dome RIGHT lobe liver image 1, not seen on repeat images through this level.  Liver, gallbladder, spleen, pancreas, kidneys, and adrenal  glands normal appearance.  Normal appendix.  Small cyst RIGHT ovary 2.3 x 1.4 cm image 72.  Nonspecific free pelvic fluid.  IUD in uterus.  Uterus and adnexa otherwise normal.  Fluid throughout distal colon.  Stomach and bowel loops otherwise normal appearance.  No mass, adenopathy, free air, hernia, or additional inflammatory process.  Osseous structures unremarkable.  IMPRESSION: Nonspecific 8 mm liver lesion.  Small RIGHT ovarian cyst with nonspecific free pelvic fluid of uncertain etiology.  Otherwise negative exam.   Electronically Signed   By: Ulyses Southward M.D.   On: 11/02/2014 16:30   US Abdomen Limited  11/02/2014   CLINICAL DATA:  Onset of right upper quadrant this morning  EXAM: US ABDOMEN LIMITED - RIGHT UPPER QUADRANT  COMPARISON:  None in  PACs  FINDINGS: Gallbladder:  No gallstones or wall thickening visualized. No sonographic Murphy sign noted.  Common bile duct:  Diameter: 3.3 mm  Liver:  The liver exhibits normal echotexture with no focal mass nor ductal dilation.  IMPRESSION: Normal limited right upper quadrant ultrasound examination.   Electronically Signed   By: David  [place] M.D.   On: 11/02/2014 12:51   I have personally reviewed and evaluated these images and lab results as part of my medical decision-making.   EKG Interpretation None      MDM   Final diagnoses:  Epigastric pain  Gastritis    Patient's pain is epigastric and right upper quadrant. It is predominantly been a burning quality. It became much worse yesterday evening. CT scan does not show acute anomaly. Right upper quadrant ultrasound does not show gallstones. At this time symptoms are then most suggestive of gastritis or peptic ulcer disease. The patient will be treated with Prilosec and Carafate with instructions on GI follow-up. She is instructed on signs and symptoms for which return to the emergency department.    Arby Barrette, MD 11/02/14 [phone]

## 2014-11-02 NOTE — ED Notes (Signed)
Patient comes in today with ABD pain x1 weeks states this morning about 0200 the pain worsen and woke her out of her sleep. Patient describes pain as burning in the epigastric area. Patient denies any radiation. Patient states she has taken Tums and nitro. Patient states she has vomited x2 since 16109. Patient denies any sexual active.Patient states she attempted to try a bland diet and no relief.

## 2014-11-02 NOTE — ED Notes (Signed)
Patient back from CT.

## 2014-11-02 NOTE — ED Notes (Signed)
Patient transported to CT 

## 2014-11-02 NOTE — ED Notes (Signed)
Patient returned from ultrasound.

## 2015-10-07 IMAGING — CT CT ABD-PELV W/ CM
1 of 7 series · 3 of 46 positions shown, 4 images · IV contrast (Iodine)
Comparison: None.

CLINICAL DATA: Abdominal pain for 1 week worse since 5855 hours,
epigastric burning, vomiting, leukocytosis

EXAM:
CT ABDOMEN AND PELVIS WITH CONTRAST
TECHNIQUE: Multidetector CT imaging of the abdomen and pelvis was performed
using the standard protocol following bolus administration of
intravenous contrast. Sagittal and coronal MPR images reconstructed
from axial data set.
CONTRAST:  100mL OMNIPAQUE IOHEXOL 300 MG/ML  SOLN

[Series 204: cor · coronal · 0.45mm/px · 3 of 127 slices shown, 4 images]
[im 32/127  soft-tissue]
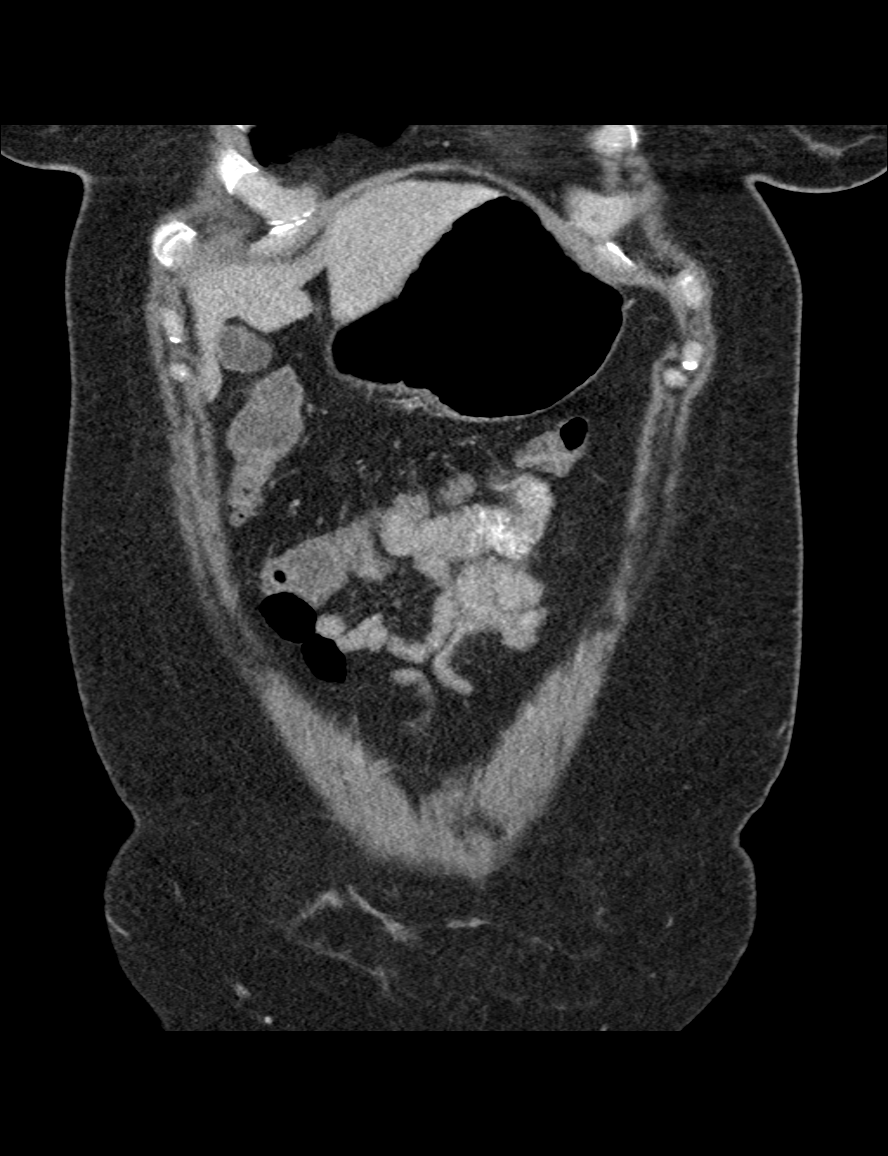
[im 32/127  bone]
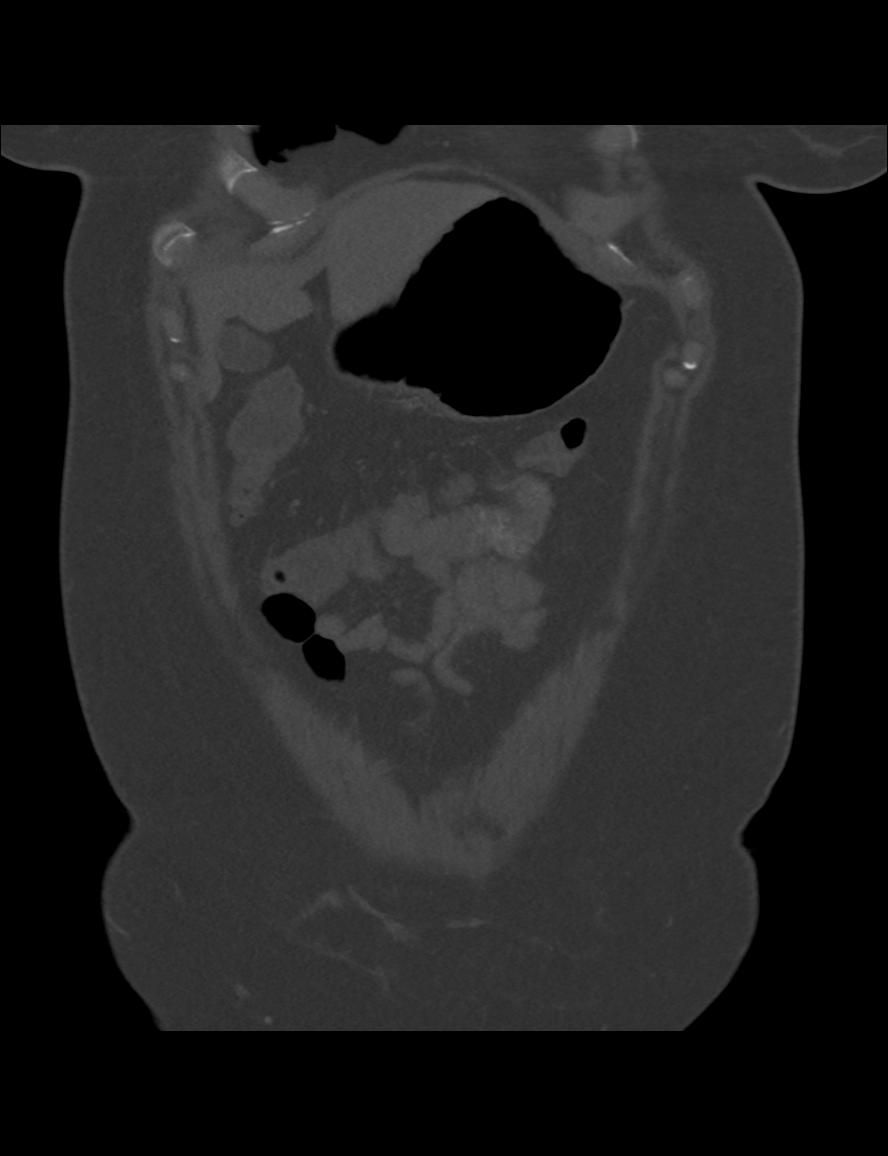
[im 64/127  soft-tissue]
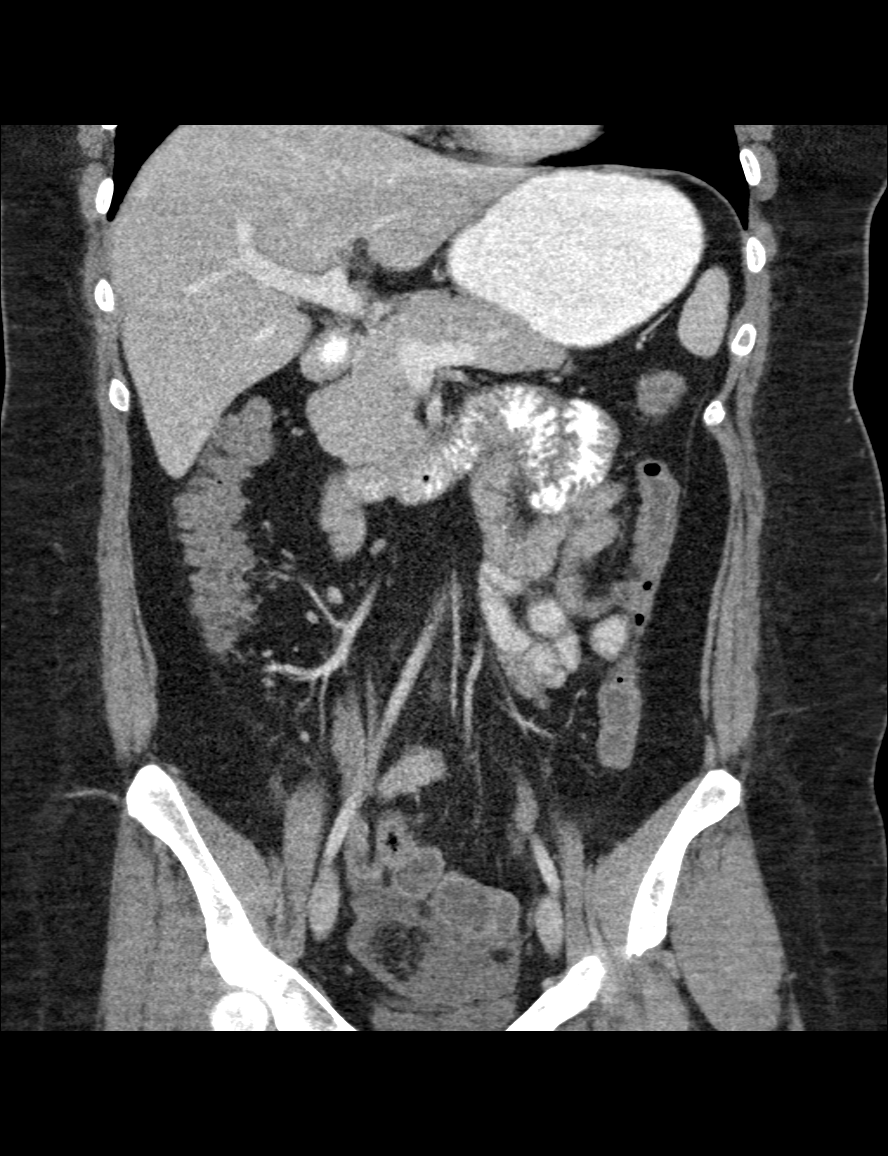
[im 95/127  soft-tissue]
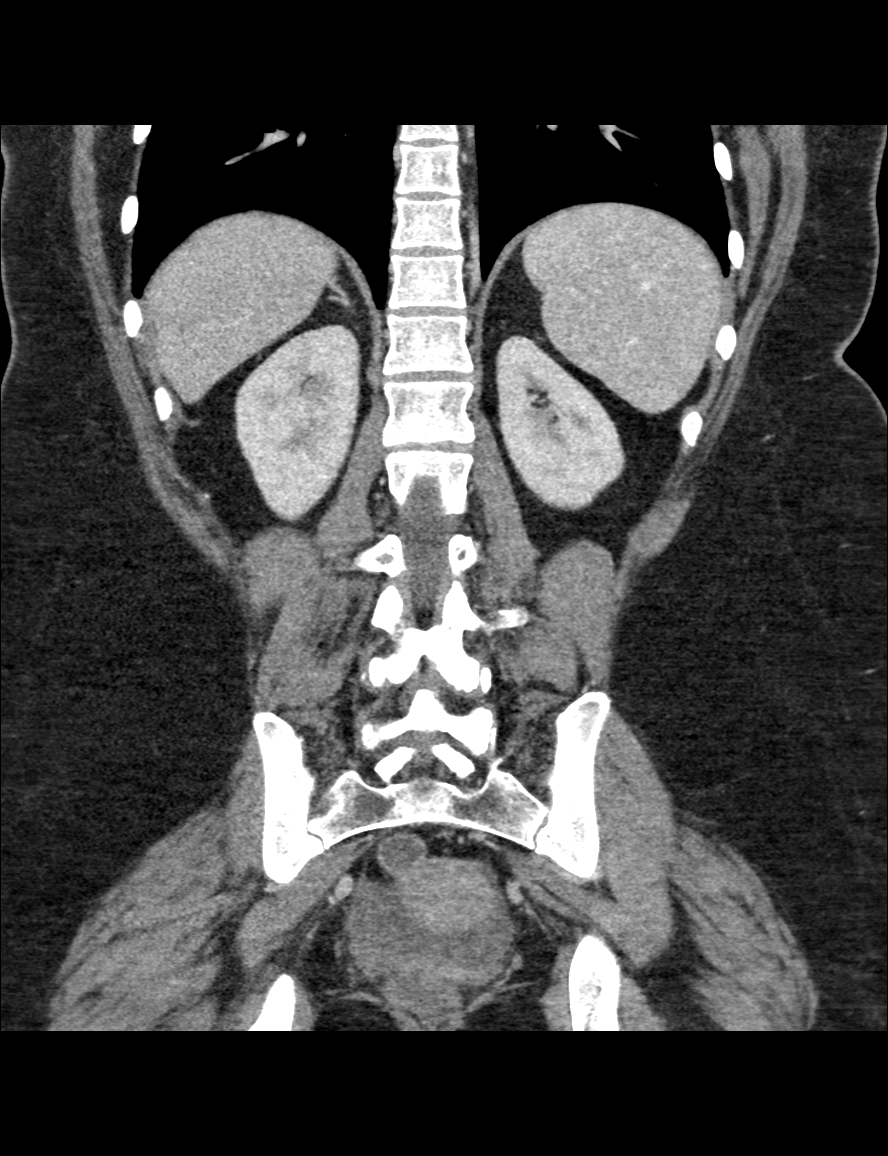

[3 of 46 positions shown; findings below may reference images not displayed]

FINDINGS: Lung bases clear.

Nonspecific 8 mm low-attenuation focus dome RIGHT lobe liver image
1, not seen on repeat images through this level.

Liver, gallbladder, spleen, pancreas, kidneys, and adrenal glands
normal appearance.

Normal appendix.

Small cyst RIGHT ovary 2.3 x 1.4 cm image 72.

Nonspecific free pelvic fluid.

IUD in uterus.

Uterus and adnexa otherwise normal.

Fluid throughout distal colon.

Stomach and bowel loops otherwise normal appearance.

No mass, adenopathy, free air, hernia, or additional inflammatory
process.

Osseous structures unremarkable.
IMPRESSION: Nonspecific 8 mm liver lesion.

Small RIGHT ovarian cyst with nonspecific free pelvic fluid of
uncertain etiology.

Otherwise negative exam.

## 2016-11-02 ENCOUNTER — Other Ambulatory Visit: Payer: Self-pay | Admitting: Obstetrics and Gynecology

## 2016-11-02 DIAGNOSIS — S2002XD Contusion of left breast, subsequent encounter: Secondary | ICD-10-CM

## 2016-11-09 ENCOUNTER — Ambulatory Visit
Admission: RE | Admit: 2016-11-09 | Discharge: 2016-11-09 | Disposition: A | Discharge status: Home / Self Care | Payer: BC Managed Care – PPO | Source: Ambulatory Visit | Attending: Obstetrics and Gynecology | Admitting: Obstetrics and Gynecology

## 2016-11-09 DIAGNOSIS — S2002XD Contusion of left breast, subsequent encounter: Secondary | ICD-10-CM

## 2019-07-30 ENCOUNTER — Other Ambulatory Visit: Payer: Self-pay | Admitting: *Deleted

## 2019-07-30 DIAGNOSIS — I499 Cardiac arrhythmia, unspecified: Secondary | ICD-10-CM

## 2019-07-30 DIAGNOSIS — R002 Palpitations: Secondary | ICD-10-CM

## 2019-08-01 ENCOUNTER — Ambulatory Visit (INDEPENDENT_AMBULATORY_CARE_PROVIDER_SITE_OTHER): Payer: BC Managed Care – PPO

## 2019-08-01 DIAGNOSIS — R002 Palpitations: Secondary | ICD-10-CM

## 2019-08-01 DIAGNOSIS — I499 Cardiac arrhythmia, unspecified: Secondary | ICD-10-CM | POA: Diagnosis not present
